# Patient Record
Sex: Male | Born: 1945 | Race: White | Hispanic: No | Marital: Married | Smoking: Former smoker
Health system: Southern US, Community
[De-identification: ages and names within clinical notes are randomized; demographics above are authoritative.]

## PROBLEM LIST (undated history)

## (undated) DIAGNOSIS — J9811 Atelectasis: Secondary | ICD-10-CM

## (undated) DIAGNOSIS — M1990 Unspecified osteoarthritis, unspecified site: Secondary | ICD-10-CM

## (undated) DIAGNOSIS — C4491 Basal cell carcinoma of skin, unspecified: Secondary | ICD-10-CM

## (undated) DIAGNOSIS — E663 Overweight: Secondary | ICD-10-CM

## (undated) DIAGNOSIS — I359 Nonrheumatic aortic valve disorder, unspecified: Secondary | ICD-10-CM

## (undated) DIAGNOSIS — R011 Cardiac murmur, unspecified: Secondary | ICD-10-CM

## (undated) DIAGNOSIS — K589 Irritable bowel syndrome without diarrhea: Secondary | ICD-10-CM

## (undated) DIAGNOSIS — Z9889 Other specified postprocedural states: Secondary | ICD-10-CM

## (undated) DIAGNOSIS — D649 Anemia, unspecified: Secondary | ICD-10-CM

## (undated) DIAGNOSIS — Z87442 Personal history of urinary calculi: Secondary | ICD-10-CM

## (undated) DIAGNOSIS — R0602 Shortness of breath: Secondary | ICD-10-CM

## (undated) DIAGNOSIS — J942 Hemothorax: Secondary | ICD-10-CM

## (undated) DIAGNOSIS — E785 Hyperlipidemia, unspecified: Secondary | ICD-10-CM

## (undated) DIAGNOSIS — K279 Peptic ulcer, site unspecified, unspecified as acute or chronic, without hemorrhage or perforation: Secondary | ICD-10-CM

## (undated) DIAGNOSIS — S2231XA Fracture of one rib, right side, initial encounter for closed fracture: Secondary | ICD-10-CM

## (undated) DIAGNOSIS — D492 Neoplasm of unspecified behavior of bone, soft tissue, and skin: Secondary | ICD-10-CM

## (undated) DIAGNOSIS — I10 Essential (primary) hypertension: Secondary | ICD-10-CM

## (undated) DIAGNOSIS — N200 Calculus of kidney: Secondary | ICD-10-CM

## (undated) DIAGNOSIS — E7800 Pure hypercholesterolemia, unspecified: Secondary | ICD-10-CM

## (undated) HISTORY — DX: Anemia, unspecified: D64.9

## (undated) HISTORY — PX: CHOLECYSTECTOMY: SHX55

## (undated) HISTORY — DX: Overweight: E66.3

## (undated) HISTORY — DX: Atelectasis: J98.11

## (undated) HISTORY — DX: Nonrheumatic aortic valve disorder, unspecified: I35.9

## (undated) HISTORY — PX: ADENOIDECTOMY: SUR15

## (undated) HISTORY — DX: Hyperlipidemia, unspecified: E78.5

## (undated) HISTORY — PX: COLONOSCOPY WITH ESOPHAGOGASTRODUODENOSCOPY (EGD): SHX5779

## (undated) HISTORY — PX: KNEE ARTHROSCOPY: SHX127

## (undated) HISTORY — DX: Neoplasm of unspecified behavior of bone, soft tissue, and skin: D49.2

## (undated) HISTORY — DX: Irritable bowel syndrome without diarrhea: K58.9

## (undated) HISTORY — PX: ELBOW SURGERY: SHX618

## (undated) HISTORY — DX: Other specified postprocedural states: Z98.89

## (undated) HISTORY — DX: Personal history of urinary calculi: Z87.442

## (undated) HISTORY — PX: TONSILLECTOMY AND ADENOIDECTOMY: SUR1326

## (undated) HISTORY — DX: Peptic ulcer, site unspecified, unspecified as acute or chronic, without hemorrhage or perforation: K27.9

---

## 1999-08-07 ENCOUNTER — Ambulatory Visit (HOSPITAL_BASED_OUTPATIENT_CLINIC_OR_DEPARTMENT_OTHER): Admission: RE | Admit: 1999-08-07 | Discharge: 1999-08-07 | Payer: Self-pay | Admitting: Orthopedic Surgery

## 2000-09-11 ENCOUNTER — Encounter: Admission: RE | Admit: 2000-09-11 | Discharge: 2000-09-11 | Payer: Self-pay | Admitting: Urology

## 2000-09-11 ENCOUNTER — Encounter: Payer: Self-pay | Admitting: Urology

## 2000-09-19 ENCOUNTER — Encounter: Admission: RE | Admit: 2000-09-19 | Discharge: 2000-09-19 | Payer: Self-pay | Admitting: Urology

## 2000-09-19 ENCOUNTER — Encounter: Payer: Self-pay | Admitting: Urology

## 2005-02-05 ENCOUNTER — Ambulatory Visit: Payer: Self-pay | Admitting: Internal Medicine

## 2007-06-04 ENCOUNTER — Ambulatory Visit: Payer: Self-pay | Admitting: Cardiology

## 2007-06-20 ENCOUNTER — Ambulatory Visit: Payer: Self-pay

## 2007-06-20 ENCOUNTER — Encounter: Payer: Self-pay | Admitting: Cardiology

## 2007-06-20 ENCOUNTER — Encounter: Payer: Self-pay | Admitting: Internal Medicine

## 2007-06-20 LAB — CONVERTED CEMR LAB
BUN: 13 mg/dL (ref 6–23)
Basophils Absolute: 0 10*3/uL (ref 0.0–0.1)
Basophils Relative: 0.7 % (ref 0.0–1.0)
CO2: 29 meq/L (ref 19–32)
Calcium: 9 mg/dL (ref 8.4–10.5)
Chloride: 110 meq/L (ref 96–112)
Cholesterol: 237 mg/dL (ref 0–200)
Creatinine, Ser: 0.8 mg/dL (ref 0.4–1.5)
Direct LDL: 143.3 mg/dL
Eosinophils Absolute: 0.2 10*3/uL (ref 0.0–0.6)
Eosinophils Relative: 4.1 % (ref 0.0–5.0)
GFR calc Af Amer: 126 mL/min
GFR calc non Af Amer: 104 mL/min
Glucose, Bld: 95 mg/dL (ref 70–99)
HCT: 40.4 % (ref 39.0–52.0)
HDL: 44 mg/dL (ref >39.0)
Hemoglobin: 13.8 g/dL (ref 13.0–17.0)
Lymphocytes Relative: 33.4 % (ref 12.0–46.0)
MCHC: 34.1 g/dL (ref 30.0–36.0)
MCV: 88.2 fL (ref 78.0–100.0)
Monocytes Absolute: 0.4 10*3/uL (ref 0.2–0.7)
Monocytes Relative: 11.1 % — ABNORMAL HIGH (ref 3.0–11.0)
Neutro Abs: 1.9 10*3/uL (ref 1.4–7.7)
Neutrophils Relative %: 50.7 % (ref 43.0–77.0)
Platelets: 257 10*3/uL (ref 150–400)
Potassium: 4.3 meq/L (ref 3.5–5.1)
RBC: 4.58 M/uL (ref 4.22–5.81)
RDW: 11.9 % (ref 11.5–14.6)
Sodium: 142 meq/L (ref 135–145)
TSH: 2.02 microintl units/mL (ref 0.35–5.50)
Total CHOL/HDL Ratio: 5.4
Triglycerides: 162 mg/dL — ABNORMAL HIGH (ref 0–149)
VLDL: 32 mg/dL (ref 0–40)
WBC: 3.7 10*3/uL — ABNORMAL LOW (ref 4.5–10.5)

## 2007-08-27 ENCOUNTER — Ambulatory Visit: Payer: Self-pay | Admitting: Cardiology

## 2008-03-09 ENCOUNTER — Ambulatory Visit: Payer: Self-pay | Admitting: Cardiology

## 2008-04-20 ENCOUNTER — Ambulatory Visit: Payer: Self-pay | Admitting: Cardiology

## 2008-04-20 LAB — CONVERTED CEMR LAB
ALT: 24 units/L (ref 0–53)
AST: 21 units/L (ref 0–37)
Albumin: 3.6 g/dL (ref 3.5–5.2)
Alkaline Phosphatase: 62 units/L (ref 39–117)
Bilirubin, Direct: 0.1 mg/dL (ref 0.0–0.3)
Cholesterol: 198 mg/dL (ref 0–200)
HDL: 52.2 mg/dL (ref >39.0)
LDL Cholesterol: 124 mg/dL — ABNORMAL HIGH (ref 0–99)
Total Bilirubin: 1 mg/dL (ref 0.3–1.2)
Total CHOL/HDL Ratio: 3.8
Total Protein: 6.5 g/dL (ref 6.0–8.3)
Triglycerides: 110 mg/dL (ref 0–149)
VLDL: 22 mg/dL (ref 0–40)

## 2008-06-02 ENCOUNTER — Ambulatory Visit: Payer: Self-pay | Admitting: Cardiology

## 2008-09-03 ENCOUNTER — Encounter: Payer: Self-pay | Admitting: Internal Medicine

## 2008-11-29 ENCOUNTER — Ambulatory Visit: Payer: Self-pay | Admitting: Internal Medicine

## 2008-11-29 DIAGNOSIS — Z87442 Personal history of urinary calculi: Secondary | ICD-10-CM

## 2008-11-29 DIAGNOSIS — D649 Anemia, unspecified: Secondary | ICD-10-CM

## 2008-11-29 DIAGNOSIS — K279 Peptic ulcer, site unspecified, unspecified as acute or chronic, without hemorrhage or perforation: Secondary | ICD-10-CM

## 2008-11-29 DIAGNOSIS — K589 Irritable bowel syndrome without diarrhea: Secondary | ICD-10-CM

## 2008-11-29 DIAGNOSIS — D492 Neoplasm of unspecified behavior of bone, soft tissue, and skin: Secondary | ICD-10-CM

## 2008-11-29 DIAGNOSIS — I10 Essential (primary) hypertension: Secondary | ICD-10-CM | POA: Insufficient documentation

## 2008-11-29 DIAGNOSIS — Z9889 Other specified postprocedural states: Secondary | ICD-10-CM

## 2008-11-29 DIAGNOSIS — E785 Hyperlipidemia, unspecified: Secondary | ICD-10-CM

## 2008-11-29 HISTORY — DX: Hyperlipidemia, unspecified: E78.5

## 2008-11-29 HISTORY — DX: Neoplasm of unspecified behavior of bone, soft tissue, and skin: D49.2

## 2008-11-29 HISTORY — DX: Peptic ulcer, site unspecified, unspecified as acute or chronic, without hemorrhage or perforation: K27.9

## 2008-11-29 HISTORY — DX: Personal history of urinary calculi: Z87.442

## 2008-11-29 HISTORY — DX: Other specified postprocedural states: Z98.89

## 2008-11-29 HISTORY — DX: Irritable bowel syndrome, unspecified: K58.9

## 2008-11-29 HISTORY — DX: Anemia, unspecified: D64.9

## 2009-01-05 ENCOUNTER — Ambulatory Visit: Payer: Self-pay | Admitting: Cardiology

## 2010-10-17 ENCOUNTER — Ambulatory Visit: Payer: Self-pay | Admitting: Cardiology

## 2010-10-18 ENCOUNTER — Encounter: Payer: Self-pay | Admitting: Cardiology

## 2010-10-18 DIAGNOSIS — I359 Nonrheumatic aortic valve disorder, unspecified: Secondary | ICD-10-CM

## 2010-10-18 DIAGNOSIS — E663 Overweight: Secondary | ICD-10-CM

## 2010-10-18 HISTORY — DX: Overweight: E66.3

## 2010-10-18 HISTORY — DX: Nonrheumatic aortic valve disorder, unspecified: I35.9

## 2010-10-18 LAB — CONVERTED CEMR LAB
ALT: 19 units/L (ref 0–53)
AST: 14 units/L (ref 0–37)
Albumin: 3.7 g/dL (ref 3.5–5.2)
Alkaline Phosphatase: 76 units/L (ref 39–117)
Bilirubin, Direct: 0.1 mg/dL (ref 0.0–0.3)
Cholesterol: 241 mg/dL — ABNORMAL HIGH (ref 0–200)
Direct LDL: 167.6 mg/dL
HDL: 52.1 mg/dL (ref >39.00)
Total Bilirubin: 0.9 mg/dL (ref 0.3–1.2)
Total CHOL/HDL Ratio: 5
Total Protein: 6.3 g/dL (ref 6.0–8.3)
Triglycerides: 137 mg/dL (ref 0.0–149.0)
VLDL: 27.4 mg/dL (ref 0.0–40.0)

## 2010-10-19 ENCOUNTER — Encounter: Payer: Self-pay | Admitting: Cardiology

## 2010-10-19 ENCOUNTER — Ambulatory Visit: Payer: Self-pay | Admitting: Cardiology

## 2010-11-16 ENCOUNTER — Ambulatory Visit: Payer: Self-pay | Admitting: Cardiology

## 2010-11-16 ENCOUNTER — Ambulatory Visit (HOSPITAL_COMMUNITY): Admission: RE | Admit: 2010-11-16 | Discharge: 2010-11-16 | Payer: Self-pay | Admitting: Cardiology

## 2010-11-16 ENCOUNTER — Encounter: Payer: Self-pay | Admitting: Cardiology

## 2010-11-16 ENCOUNTER — Ambulatory Visit: Payer: Self-pay

## 2010-11-27 ENCOUNTER — Ambulatory Visit: Payer: Self-pay | Admitting: Cardiology

## 2011-01-30 NOTE — Assessment & Plan Note (Signed)
Summary: f45m  Medications Added CRESTOR 10 MG TABS (ROSUVASTATIN CALCIUM) Take one tablet by mouth daily.      Allergies Added: NKDA  Visit Type:  Follow-up Primary Mamie Diiorio:  Marga Melnick MD  CC:  hypertension and dyslipidemia.  History of Present Illness:  The patient is seen for followup of dyslipidemia, hypertension, dilated aortic root.  He is going well.  He is not having significant symptoms.  He is active.  He works with a Psychologist, educational intermittently.  He has lost a small amount of weight over time but needs to lose more weight.  He has mild dilatation of his aortic root in the past.  We need to follow this up with a followup echo.  I saw him last January, 2010.  Current Medications (verified): 1)  Adult Aspirin Ec Low Strength 81 Mg Tbec (Aspirin) .Marland Kitchen.. 1 By Mouth Once Daily 2)  Crestor 5 Mg Tabs (Rosuvastatin Calcium) .Marland Kitchen.. 1 By Mouth At Bedtime 3)  Altace 5 Mg Tabs (Ramipril) .Marland Kitchen.. 1 By Mouth Once Daily 4)  Amlodipine Besylate 5 Mg Tabs (Amlodipine Besylate) .Marland Kitchen.. 1 By Mouth Once Daily 5)  Coq10 200 Mg Caps (Coenzyme Q10) .Marland Kitchen.. 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 Tolerating Crestor Obesity Hypertension mild dilatation aortic root-43 mm  .Marland Kitchen..echo... June, 2008 Soft systolic murmur-no valvular abnormalities by echo....echo... June, 2008 Trivial aortic regurgitation  ... echo... June, 2008 EF 55-65%.... echo... June, 2008.. IRBBB     incomplete right bundle branch block  Review of Systems       Patient denies fever, chills, headache, sweats, rash, change in vision, change in hearing, chest pain, cough, nausea vomiting, urinary symptoms.  All other systems are reviewed and  are negative.  Vital Signs:  Patient profile:   65 year old male Height:      69.5 inches Weight:      243 pounds BMI:     35.50 Pulse rate:   50 / minute BP sitting:   144 / 82  (left arm) Cuff size:   regular  Vitals Entered By: Hardin Negus, RMA (October 19, 2010 8:50 AM)  Physical Exam  General:  The patient is stable. Head:  head is atraumatic. Eyes:  no xanthelasma. Neck:  no jugular venous distention. Chest Wall:  no chest wall tenderness. Lungs:  lungs are clear.  Respiratory effort is nonlabored. Heart:  cardiac exam reveals S1 and S2.  There is no clicks or significant murmurs. Abdomen:  abdomen is soft. Msk:  no musculoskeletal deformities. Extremities:  no peripheral edema. Skin:  no skin rashes. Psych:  patient is oriented to person time and place.  Affect is normal.   Impression & Recommendations:  Problem # 1:  AORTIC INSUFFICIENCY (ICD-424.1)  His updated medication list for this problem includes:    Altace 5 Mg Tabs (Ramipril) .Marland Kitchen... 1 by mouth once daily This was mild in the past.  He will be reassessed with followup echo.  Problem # 2:  * DILATED AORTIC ROOT His aortic root was 43 mm when first measured in 2008.  He will be reassessed with follow up echo.  Problem # 3:  OVERWEIGHT (ICD-278.02) Once again I talked with him about continuing his exercise program and trying to lose some weight.  Problem # 4:  HYPERTENSION (ICD-401.9)  His updated medication list for this problem includes:    Adult Aspirin Ec Low Strength 81 Mg Tbec (Aspirin) .Marland Kitchen... 1 by mouth once daily    Altace 5 Mg Tabs (Ramipril) .Marland Kitchen... 1 by mouth once daily    Amlodipine Besylate 5 Mg Tabs (Amlodipine besylate) .Marland Kitchen... 1 by mouth once daily Blood pressure is under borderline control.  I encouraged him to check at [facility] and call us if his systolic is over 578.  Weight loss will help with this.  Problem # 5:  HYPERLIPIDEMIA (ICD-272.4)  The following medications were removed  from the medication list:    Crestor 10 Mg Tabs (Rosuvastatin calcium) .Marland Kitchen... Take one tablet by mouth daily. His updated medication list for this problem includes:    Crestor 10 Mg Tabs (Rosuvastatin calcium) .Marland Kitchen... Take one tablet by mouth daily. It is my understanding that the patient was taking 5 mg of Crestor.  I will reverify this.  His LDL is too high.  He needs to watch his diet better and lose weight and increase his Crestor dose.  I will be sure that the dose that we have picked is an increase for him from today.  He will then have followup labs in 6 weeks.  Problem # 6:  * IRBBB EKG is done today and reviewed by me.  There is no change.  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  No further workup needed.  I showed his EKG to him to explain what this term means.  Other Orders: Echocardiogram (Echo)  Patient Instructions: 1)  Increase Crestor to 10mg  daily 2)  Your physician recommends that you return for a FASTING lipid profile: 6 weeks (272.2) 3)  Your physician has requested that you have an echocardiogram.  Echocardiography is a painless test that uses sound waves to create images of your heart. It provides your doctor with information about the size and shape of your heart and how well your heart's chambers and valves are working.  This procedure takes approximately one hour. There are no restrictions for this procedure. 4)  Your physician wants you to follow-up in:  1 year.  You will receive a reminder letter in the mail two months in advance. If you don't receive a letter, please call our office to schedule the follow-up appointment. Prescriptions: CRESTOR 10 MG TABS (ROSUVASTATIN CALCIUM) Take one tablet by mouth daily.  #90 x 3   Entered by:   Meredith Staggers, RN   Authorized by:   Talitha Givens, MD, [facility]   Signed by:   Meredith Staggers, RN on 10/19/2010   Method used:   Electronically to        [facility]* (retail)             ,          Ph: [phone]        Fax: [phone]   RxID:   [id number]

## 2011-01-30 NOTE — Miscellaneous (Signed)
Summary: Appointment Canceled  Appointment status changed to canceled by [employer] on 10/31/2010 3:45 PM.  Cancellation Comments --------------------- echo/dilatec aortic root/bcbs prec.r eq/saf  Appointment Information ----------------------- Appt Type:  CARDIOLOGY ANCILLARY VISIT      Date:  Monday, October 30, 2010      Time:  8:30 AM for 60 min   Urgency:  Routine   Made By:  Pearson Grippe  To Visit:  [id number]    Reason:  echo/dilatec aortic root/bcbs prec.r eq/saf  Appt Comments ------------- -- 10/31/10 15:45: (CEMR) CANCELED -- echo/dilatec aortic root/bcbs prec.r eq/saf -- 10/30/10 15:12: (CEMR) NO SHOW -- echo/dilatec aortic root/bcbs prec.r eq/saf -- 10/19/10 9:32: (CEMR) BOOKED -- Routine CARDIOLOGY ANCILLARY VISIT at 10/30/2010 8:30

## 2011-01-30 NOTE — Miscellaneous (Signed)
Summary: Appointment No Show  Appointment status changed to no show by [employer] on 10/30/2010 3:12 PM.  No Show Comments ---------------- echo/dilatec aortic root/bcbs prec.r eq/saf  Appointment Information ----------------------- Appt Type:  CARDIOLOGY ANCILLARY VISIT      Date:  Monday, October 30, 2010      Time:  8:30 AM for 60 min   Urgency:  Routine   Made By:  Pearson Grippe  To Visit:  [id number]    Reason:  echo/dilatec aortic root/bcbs prec.r eq/saf  Appt Comments ------------- -- 10/30/10 15:12: (CEMR) NO SHOW -- echo/dilatec aortic root/bcbs prec.r eq/saf -- 10/19/10 9:32: (CEMR) BOOKED -- Routine CARDIOLOGY ANCILLARY VISIT at 10/30/2010 8:30 AM for 60 min echo/dilatec aortic root/bcbs prec.r eq/saf

## 2011-01-30 NOTE — Miscellaneous (Signed)
  Clinical Lists Changes  Problems: Added new problem of OVERWEIGHT (ICD-278.02) Added new problem of * DILATED AORTIC ROOT Added new problem of AORTIC INSUFFICIENCY (ICD-424.1)

## 2011-05-15 NOTE — Assessment & Plan Note (Signed)
[facility]                            CARDIOLOGY OFFICE NOTE   NAME:Jordan Baldwin, Jordan Baldwin                       MRN:          [id number]  DATE:08/27/2007                            DOB:          11-Aug-1946    Mr. Umland is here for followup.  He has felt quite well since I saw him  on June 04, 2007.  At that time, we assessed his overall cardiac status.  CBC was normal.  BMET was normal.  TSH was normal.  A 2D echo showed  that his LV size was top normal.  Ejection fraction 55% to 60% with no  significant valvular abnormalities.  Myoview showed good exercise  tolerance with no EKG change, and no signs of scar or ischemia.  His  cholesterol data revealed that his triglycerides were 162, HDL was 44,  and LDL was 143.  TSH was normal.  Today in the office he is quite  stable.   PAST MEDICAL HISTORY:   ALLERGIES:  NO KNOWN DRUG ALLERGIES.   MEDICATIONS:  The patient has been taking aspirin, but we will formerly  start it at 81 mg daily.  Also, I will be starting Zocor 20 and ramipril  2.5.   OTHER MEDICAL PROBLEMS:  See the list below.   REVIEW OF SYSTEMS:  He feels well, and he is not having any significant  complaints.   PHYSICAL EXAMINATION:  Weight is 252 pounds.  Blood pressure 150/90 with  a pulse of 60.  The patient is oriented to person, time, and place.  His affect is  normal.  HEENT:  Reveals no xanthelasma.  He has normal extraocular motion.  There are no carotid bruits.  There is no jugular venous distention.  LUNGS:  Clear.  CARDIAC EXAM:  Reveals an S1 with an S2.  There are no clicks or  significant murmurs.  There is no peripheral edema.   PROBLEMS:  1. History of knee surgery in the past, and elbow surgery.  2. Status post gallbladder surgery.  3. History of irritable bowel.  4. History of kidney stones.  5. 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  6. History of skin cancer on the back of his neck, removed.  7. Hypertension.   His blood pressure does remain mildly elevated.  We      will start ramipril today.  He will be checking his pressure at      home several times a week, and calling the pressures in to [place], and      we will adjust his medication by telephone.  8. Soft systolic murmur.  He has no significant valvular abnormalities      by echo.  9. Weight.  He is beginning his weight loss program.  10.Abnormal feeling in his chest.  He has had no return of this.  His      Myoview was normal.  No further workup now.  11.Question of mild peripheral edema.  He has none today.  12.Elevated LDL and mildly elevated triglycerides.   The plan will be to start ramipril 2.5, and  have blood pressure  followups.  He will start Simvastatin 20, and we will recheck liver and  labs in 8 weeks.  He will be on aspirin 81.  I have given him an  appointment for a 61-month followup, but I will be in touch with him  sooner.     Luis Abed, MD, [facility]  Electronically Signed    JDK/MedQ  DD: 08/27/2007  DT: 08/27/2007  Job #: [id number]   cc:   Titus Dubin. Alwyn Ren, MD,FACP,FCCP

## 2011-05-15 NOTE — Assessment & Plan Note (Signed)
[facility]                            CARDIOLOGY OFFICE NOTE   NAME:Jordan, BARTLETT Baldwin                       MRN:          [id number]  DATE:01/05/2009                            DOB:          10-03-1946    Mr. Jordan Baldwin is here for followup.  At the [facility], amlodipine was added to his  meds and he has excellent blood pressure control.  He saw Dr. Alwyn Baldwin  recently for complete history and physical.  His LDL remains in the  range of 130.  His HDL is good.  This is while taking 5 of Crestor.  Previously, he did not tolerate Zocor well, but he is tolerating  Crestor.  He is not having any chest pain.  He is overweight.  He has  gained some more weight.  We have talked about this and he says that he  will begin to try to lose weight.   ALLERGIES:  He had felt poorly with ZOCOR.   MEDICATIONS:  1. Aspirin 81.  2. Multivitamin.  3. Ramipril 5.  4. Amlodipine 5.  5. Crestor 5 mg (to be increased to 10 mg).   OTHER MEDICAL PROBLEMS:  See the list on my note of June 02, 2008.   REVIEW OF SYSTEMS:  He is feeling well.  He has no fevers or chills.  He  has no headaches.  There are no skin rashes.  He has some mild back  discomfort.  His review of systems otherwise is negative.  He had an  episode of bronchitis and he still has a very mild cough, but it is  continuing to improve.   PHYSICAL EXAMINATION:  Weight is 251 pounds.  Blood pressure is 126/82  with a pulse of 59.  The patient is oriented to person, time, and place.  Affect is normal.  HEENT reveals no xanthelasma.  He has normal  extraocular motion.  There are no carotid bruits.  There is no jugular  venous distention.  Lungs are clear.  Respiratory effort is not labored.  Cardiac exam reveals an S1 with an S2.  There are no clicks or  significant murmurs.  His abdomen is soft.  He has no peripheral edema.   EKG reveals old incomplete right bundle-branch block.   Problems are listed on my note of June 02, 2008.  #8.  Hypertension.  This is now well treated.  #10.  Overweight.  This will be a key issue for him.  #12.  Elevated LDL.  We will push his Crestor up to 10 mg.     Jordan Abed, MD, [facility]  Electronically Signed    JDK/MedQ  DD: 01/05/2009  DT: 01/06/2009  Job #: [id number]   cc:   Jordan Baldwin. Jordan Ren, MD,FACP,FCCP

## 2011-05-15 NOTE — Assessment & Plan Note (Signed)
[facility]                            CARDIOLOGY OFFICE NOTE   NAME:Jordan Baldwin, Jordan Baldwin                       MRN:          [id number]  DATE:06/02/2008                            DOB:          [birth year]    Mr. Avans is here for followup.  I saw him last in March of 2009.  At  that time his blood pressure was reasonably controlled on low-dose ACE  inhibitor.  He had a history of elevated LDL and triglycerides.  There  is a question that he had felt poorly with Zocor.  We carefully  discussed trying another statin, and I placed him on Crestor 5 and he is  tolerating this.  He did have some followup labs that are dated April 20, 2008.  His liver functions were fine.  Total cholesterol was 198  with triglycerides 110, HDL 52, and his LDL was down from 140 to 124.  The patient does not have proven coronary or proven vascular disease.   PAST MEDICAL HISTORY:   ALLERGIES:  No known drug allergies.   MEDICATIONS:  1. Crestor 5.  2. Ramipril 2.5.  3. Aspirin 81.  4. Multivitamin.   OTHER MEDICAL PROBLEMS:  See the list below.   REVIEW OF SYSTEMS:  He is having some back pain.  He is going to see a  chiropractor today.  This does not sound like ischemia.  Otherwise, his  review of systems is negative.   PHYSICAL EXAMINATION:  Weight today is 249 pounds.  Blood pressure is  140/86 with a pulse of 54.  The patient is oriented to person, time, and place.  Affect is normal.  HEENT:  No xanthelasma.  He has normal extraocular  motion.  There are  no carotid bruits.  There is no jugular venous distension.  LUNGS:  Clear.  Respiratory effort is not labored.  CARDIAC EXAM:  An S1 with an S2.  There are no clicks or significant  murmurs.  His abdomen is protuberant but soft.  He has no peripheral edema.   EKG reveals no significant change.   PROBLEMS:  1. History of knee surgery in the past.  2. History of elbow surgery in the past.  3. Status post  cholecystectomy.  4. History of irritable bowel.  5. History of kidney stones.  6. 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  7. History of skin cancer on the back and the neck, removed.  8. Hypertension.  Blood pressure is treated on low-dose ramipril.  9. Soft systolic murmur, no valvular abnormalities.  10.Overweight.  We talked about this again.  He says that he will be      getting back to his exercise program.  This will be a very      important issue for him.  11.Question of mild peripheral edema in the past.  This has not been a      significant problem.  12.Elevated LDL and mildly elevated triglycerides.  On low-dose      Crestor triglycerides have normalized.  LDL is 124.  There is no  proven coronary disease.  I promised him we would not push      medicines any higher than absolutely necessary.  With this in mind,      we will keep him at his current dose of medicines.  13.I will see him back in 6 months for followup.     Luis Abed, MD, [facility]  Electronically Signed    JDK/MedQ  DD: 06/02/2008  DT: 06/02/2008  Job #: [id number]   cc:   Titus Dubin. Alwyn Ren, MD,FACP,FCCP

## 2011-05-15 NOTE — Assessment & Plan Note (Signed)
[facility]                            CARDIOLOGY OFFICE NOTE   NAME:Jordan Baldwin, Jordan Baldwin                       MRN:          [id number]  DATE:06/04/2007                            DOB:          1946/11/06    Jordan Baldwin is a friend of the family and has a family history of coronary  disease and he is here for full cardiac evaluation.  He has been active  over the years.  He has had a vague fluttering sensation in his chest on  a few occasions.  He has not had any major chest pain.  He has no major  shortness of breath.  He does not have syncope or presyncope.  He was  encouraged to start some Lipitor in the past which he chose not to do.  He also has had a mildly elevated blood pressure when checked when he  had some dental work done recently.   PAST MEDICAL HISTORY:   ALLERGIES:  There are no known drug allergies.   MEDICATIONS:  The patient is on no medications.   OTHER MEDICAL PROBLEMS:  See the complete list below.   SOCIAL HISTORY:  The patient is married.  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  He continues to work in various  capacities in other ways at this time.  He does not smoke.  Historically  he stopped smoking in 1976.  He does drink wine and a caffeinated  beverage daily.   FAMILY HISTORY:  The patient's father had an MI in his 75s.  His mother  is alive at age 55 and he has a sister and a brother alive at age 56 and  66 with no documented coronary disease.   REVIEW OF SYSTEMS:  He has had some problems with irritable bowel in the  past and he has had kidney stones and has some arthritis and difficulty  in his right knee.  He had a skin cancer on the back of his neck removed  in 1998.  Overall he feels well and his review of systems otherwise is  negative.  He has noted some mild edema recently.  This comes on during  the day and goes away at night.  There are no major musculoskeletal  deformities.   PHYSICAL EXAMINATION:  Weight today is 252 pounds.  His blood pressure  is 150/92 with a pulse of 67.  The patient is oriented to person, time and place.  Affect is normal.  HEENT:  Reveals no xanthelasma.  He has normal extraocular motion.  His  conjunctivae are normal.  LUNGS:  Clear.  Respiratory effort is not labored.  CARDIAC:  Reveals an S1 with an S2.  There is a 2/6 systolic murmur that  is heard best at the apex.  ABDOMEN:  Obese, but soft.  He has trace peripheral edema.   EKG reveals non-specific ST-T-wave changes and some increased voltage.   PROBLEMS:  1. History of knee surgery in the past and elbow surgery.  2. Status post gallbladder surgery in the past.  3. History of some irritable bowel.  4. History of kidney stones.  5. Arthritis in his hands and feet that is mild.  6. History of a skin cancer removed from the back of his neck in 1998.  7. Hypertension.  He is to begin his weight loss program today and we      will watch his blood pressure.  I have chosen not to start any      medications today, but he may well need treatment.  Mildly abnormal      EKG with non-specific ST-T-wave changes.  8. Systolic murmur.  Exact etiology is not clear to me as it is best      heard at the apex.  He does need a 2-D echo to assess his bowels      and to assess for left ventricular hypertrophy.  9. Overweight.  He and I have talked in length about beginning a      weight loss program which he will.  10.Abnormal feeling in his chest.  We need to be sure this is not      angina.  I doubt any major arrhythmias.  He will have a stress      Myoview scan.  We will try to have him walk on the treadmill.  He      says that in the past he has not been able to elevate his heart      rate significantly on a treadmill.  I suspect we will be able to.      If not we will change to adenosine.  11.Mild peripheral edema.  This is probably venous.  We will approach      this further after we see  his echo.  12.Some discomfort in his right knee historically.   The patient will have a CBC, BMET and TSH.  A 2-D echo will be done.  He  will have a Myoview as described and he will have a fasting lipid  profile.  He will begin to work on his weight and we will be back in  touch with him with the results of his studies.     Luis Abed, MD, [facility]  Electronically Signed    JDK/MedQ  DD: 06/04/2007  DT: 06/05/2007  Job #: [id number]   cc:   Titus Dubin. Alwyn Ren, MD,FACP,FCCP

## 2011-05-15 NOTE — Assessment & Plan Note (Signed)
[facility]                            CARDIOLOGY OFFICE NOTE   NAME:Burkemper, JAMIEN CASANOVA                       MRN:          [id number]  DATE:03/09/2008                            DOB:          1946/04/25    HISTORY:  Mr. Bebout is seen for cardiology followup.  We know he has  good LV function by echo.  We know that his Myoview scan revealed no  scar or ischemia.  He had an LDL of 143 and his triglycerides were 162  last summer.  We tried 20 of Zocor and he felt aches and pain.   He has lost some weight, but unfortunately gained some back.  He is down  5 pounds since August 2008.  His blood pressure is under good control.  He has not had any chest pain or shortness of breath.   ALLERGIES:  NO KNOWN DRUG ALLERGIES, but he seems to be intolerant to  ZOCOR.   MEDICATIONS:  1. Aspirin 81.  2. Ramipril 2.5.  3. Multivitamin.   OTHER MEDICAL PROBLEMS:  See the list on my note of August 27, 2007.   REVIEW OF SYSTEMS:  He is feeling well.  He is disappointed with his  weight, but he knows its from his overall dietary approach.   PHYSICAL EXAMINATION:  VITAL SIGNS:  Blood pressure is 123/75 with a  pulse of 54.  GENERAL:  The patient is oriented to person, time and place.  Affect is  normal.  HEENT:  Reveals no xanthelasma.  He has normal extraocular  motion.  NECK:  There are no carotid bruits.  There is no jugular venous  distention.  LUNGS:  Clear.  Respiratory effort is not labored.  CARDIAC:  Reveals S1-S2.  There are no clicks or significant murmurs.  ABDOMEN:  Obese, but soft.  EXTREMITIES:  He has no peripheral edema.   DIAGNOSTICS:  EKG today reveals incomplete right bundle branch block.  This is a slight change from the past, but not diagnostic.   PROBLEMS:  Problems are listed on the note of August 27, 2007.  1. Hypertension.  On low-dose ramipril, he appears to be nicely      controlled  2. Elevated LDL and elevated triglycerides.  I  discussed with him at      great length the importance again of losing weight.  We also talked      about his lipids.  I feel that he should be      on a statin.  After a long discussion, he finally agreed to try 5      mg of Crestor.  He will also try to lose weight and I will see him      back in 3 months specifically to check his progress.     Luis Abed, MD, [facility]  Electronically Signed    JDK/MedQ  DD: 03/09/2008  DT: 03/10/2008  Job #: [id number]   cc:   Titus Dubin. Alwyn Ren, MD,FACP,FCCP

## 2013-07-28 ENCOUNTER — Inpatient Hospital Stay (HOSPITAL_COMMUNITY): Payer: Medicare Other

## 2013-07-28 ENCOUNTER — Encounter (HOSPITAL_COMMUNITY): Payer: Self-pay | Admitting: Emergency Medicine

## 2013-07-28 ENCOUNTER — Emergency Department (HOSPITAL_COMMUNITY): Payer: Medicare Other

## 2013-07-28 ENCOUNTER — Inpatient Hospital Stay (HOSPITAL_COMMUNITY)
Admission: EM | Admit: 2013-07-28 | Discharge: 2013-08-02 | DRG: 167 | Disposition: A | Discharge status: Home / Self Care | Payer: Medicare Other | Attending: Thoracic Surgery (Cardiothoracic Vascular Surgery) | Admitting: Thoracic Surgery (Cardiothoracic Vascular Surgery)

## 2013-07-28 DIAGNOSIS — E785 Hyperlipidemia, unspecified: Secondary | ICD-10-CM | POA: Diagnosis present

## 2013-07-28 DIAGNOSIS — S2231XA Fracture of one rib, right side, initial encounter for closed fracture: Secondary | ICD-10-CM

## 2013-07-28 DIAGNOSIS — I359 Nonrheumatic aortic valve disorder, unspecified: Secondary | ICD-10-CM | POA: Diagnosis present

## 2013-07-28 DIAGNOSIS — Z85828 Personal history of other malignant neoplasm of skin: Secondary | ICD-10-CM

## 2013-07-28 DIAGNOSIS — E663 Overweight: Secondary | ICD-10-CM | POA: Diagnosis present

## 2013-07-28 DIAGNOSIS — J9811 Atelectasis: Secondary | ICD-10-CM

## 2013-07-28 DIAGNOSIS — R0789 Other chest pain: Secondary | ICD-10-CM

## 2013-07-28 DIAGNOSIS — K5900 Constipation, unspecified: Secondary | ICD-10-CM | POA: Diagnosis not present

## 2013-07-28 DIAGNOSIS — D649 Anemia, unspecified: Secondary | ICD-10-CM | POA: Diagnosis present

## 2013-07-28 DIAGNOSIS — J9819 Other pulmonary collapse: Secondary | ICD-10-CM | POA: Diagnosis present

## 2013-07-28 DIAGNOSIS — S271XXA Traumatic hemothorax, initial encounter: Principal | ICD-10-CM | POA: Diagnosis present

## 2013-07-28 DIAGNOSIS — W19XXXA Unspecified fall, initial encounter: Secondary | ICD-10-CM | POA: Diagnosis present

## 2013-07-28 DIAGNOSIS — J942 Hemothorax: Secondary | ICD-10-CM

## 2013-07-28 DIAGNOSIS — Y92009 Unspecified place in unspecified non-institutional (private) residence as the place of occurrence of the external cause: Secondary | ICD-10-CM

## 2013-07-28 DIAGNOSIS — I10 Essential (primary) hypertension: Secondary | ICD-10-CM | POA: Diagnosis present

## 2013-07-28 HISTORY — DX: Unspecified osteoarthritis, unspecified site: M19.90

## 2013-07-28 HISTORY — DX: Basal cell carcinoma of skin, unspecified: C44.91

## 2013-07-28 HISTORY — DX: Fracture of one rib, right side, initial encounter for closed fracture: S22.31XA

## 2013-07-28 HISTORY — DX: Hemothorax: J94.2

## 2013-07-28 HISTORY — PX: CHEST TUBE INSERTION: SHX231

## 2013-07-28 HISTORY — DX: Calculus of kidney: N20.0

## 2013-07-28 HISTORY — DX: Cardiac murmur, unspecified: R01.1

## 2013-07-28 HISTORY — DX: Shortness of breath: R06.02

## 2013-07-28 HISTORY — DX: Atelectasis: J98.11

## 2013-07-28 HISTORY — DX: Pure hypercholesterolemia, unspecified: E78.00

## 2013-07-28 HISTORY — DX: Essential (primary) hypertension: I10

## 2013-07-28 LAB — CBC
MCH: 29.7 pg (ref 26.0–34.0)
MCHC: 34.1 g/dL (ref 30.0–36.0)
RDW: 12.3 % (ref 11.5–15.5)

## 2013-07-28 LAB — BASIC METABOLIC PANEL
BUN: 15 mg/dL (ref 6–23)
Calcium: 9.3 mg/dL (ref 8.4–10.5)
GFR calc Af Amer: >90 mL/min (ref >90)
GFR calc non Af Amer: >90 mL/min (ref >90)
Potassium: 3.9 mEq/L (ref 3.5–5.1)
Sodium: 138 mEq/L (ref 135–145)

## 2013-07-28 MED ORDER — ONDANSETRON HCL 4 MG/2ML IJ SOLN: 4.0000 mg | Freq: Four times a day (QID) | INTRAMUSCULAR | Status: DC | PRN

## 2013-07-28 MED ORDER — MIDAZOLAM HCL 2 MG/2ML IJ SOLN
2.0000 mg | Freq: Once | INTRAMUSCULAR | Status: AC
  Administered 2013-07-28: 2 mg via INTRAVENOUS

## 2013-07-28 MED ORDER — FENTANYL CITRATE 0.05 MG/ML IJ SOLN
100.0000 ug | Freq: Once | INTRAMUSCULAR | Status: AC
  Administered 2013-07-28: 100 ug via INTRAVENOUS

## 2013-07-28 MED ORDER — ACETAMINOPHEN 325 MG PO TABS: 650.0000 mg | ORAL_TABLET | Freq: Four times a day (QID) | ORAL | Status: DC | PRN

## 2013-07-28 MED ORDER — FENTANYL CITRATE 0.05 MG/ML IJ SOLN
INTRAMUSCULAR | Status: AC
  Filled 2013-07-28: qty 2

## 2013-07-28 MED ORDER — OXYCODONE HCL 5 MG PO TABS
5.0000 mg | ORAL_TABLET | ORAL | Status: DC | PRN
  Administered 2013-07-28 – 2013-07-30 (×8): 10 mg via ORAL
  Filled 2013-07-28 (×8): qty 2

## 2013-07-28 MED ORDER — MIDAZOLAM HCL 2 MG/2ML IJ SOLN
INTRAMUSCULAR | Status: AC
  Filled 2013-07-28: qty 4

## 2013-07-28 NOTE — ED Notes (Signed)
Chest tube tray and [place] placed at bedside for Dr. Barry Dienes

## 2013-07-28 NOTE — ED Notes (Signed)
Pt c/o right sided rib pain after falling last Thursday; pt sts seen at [facility]; pt had another xray today and sent here for eval for pleural effusion

## 2013-07-28 NOTE — ED Provider Notes (Signed)
CSN: [id number]     Arrival date & time 07/28/13  1150 History     First MD Initiated Contact with Patient 07/28/13 1202     Chief Complaint  Patient presents with  . Flank Pain   (Consider location/radiation/quality/duration/timing/severity/associated sxs/prior Treatment) HPI Jordan Baldwin 67 y.o. presented at the recommendation of his orthopedic surgeon for possible right-sided pleural effusion. Patient presented to his orthopedic surgeon for concern of right-sided elbow pain related to a fall 7 days ago. Patient had a mechanical fall 7 days ago in [place], [place]. She reportedly slipped on a wet surface landing on his right thorax. He noticed immediate pain in his right elbow and right thorax. He was seen at a local emergency department where he had a chest x-ray performed, x-ray of his right elbow, and x-ray of his right shoulder which showed no evidence of fractures, pleural effusions. Patient is been using his incentive spirometer trying greater than 1500 cc. He is ongoing right-sided pain, but has been improving compared to the onset. His refrain from any exercise as well. He does note to be somewhat short of breath with "a good amount of walking". He denies chest pain, nausea, vomiting, diaphoresis. Today at his orthopedic surgeon's office he was noted to have a right-sided pleural effusion and was sent here for further evaluation and management. Imaging is not currently available. No cardiac history. No current use of blood thinners or antiplatelets.  History reviewed. No pertinent past medical history. History reviewed. No pertinent past surgical history. History reviewed. No pertinent family history. History  Substance Use Topics  . Smoking status: Never Smoker   . Smokeless tobacco: Not on file  . Alcohol Use: Yes     Comment: occ    Review of Systems  Constitutional: Negative for fever, chills, diaphoresis, activity change, appetite change and fatigue.  HENT: Negative  for congestion, sore throat, rhinorrhea, sneezing and trouble swallowing.   Eyes: Negative for pain and redness.  Respiratory: Positive for chest tightness and shortness of breath. Negative for cough, choking, wheezing and stridor.   Cardiovascular: Positive for chest pain (Chest wall- thorax right-sided). Negative for leg swelling.  Gastrointestinal: Positive for nausea. Negative for vomiting, diarrhea, constipation, blood in stool, abdominal distention and anal bleeding.  Musculoskeletal: Negative for myalgias and back pain.  Skin: Negative for rash.  Neurological: Negative for dizziness, speech difficulty, weakness, light-headedness, numbness and headaches.  Hematological: Negative for adenopathy.  Psychiatric/Behavioral: Negative for confusion.    Allergies  Review of patient's allergies indicates no known allergies.  Home Medications   Current Outpatient Rx  Name  Route  Sig  Dispense  Refill  . ibuprofen (ADVIL,MOTRIN) 200 MG tablet   Oral   Take 200 mg by mouth every 6 (six) hours as needed for pain.          BP 165/93  Pulse 62  Temp(Src) 97.4 F (36.3 C) (Oral)  Resp 17  SpO2 95% Physical Exam  Nursing note and vitals reviewed. Constitutional: He is oriented to person, place, and time. He appears well-developed and well-nourished. No distress.  HENT:  Head: Normocephalic and atraumatic.  Eyes: Conjunctivae and EOM are normal. Right eye exhibits no discharge. Left eye exhibits no discharge.  Neck: Normal range of motion. Neck supple. No tracheal deviation present.  Cardiovascular: Normal rate, regular rhythm and normal heart sounds.  Exam reveals no friction rub.   No murmur heard. Pulmonary/Chest: Effort normal. No stridor. No respiratory distress. He has decreased breath sounds  in the right middle field and the right lower field. He has no wheezes. He has no rhonchi. He has no rales. He exhibits no tenderness.  Abdominal: Soft. He exhibits no distension. There is  no tenderness. There is no rebound and no guarding.  Neurological: He is alert and oriented to person, place, and time.  Skin: Skin is warm.  Psychiatric: He has a normal mood and affect.    ED Course   Procedures (including critical care time)  Labs Reviewed  CBC - Abnormal; Notable for the following:    RBC 3.64 (*)    Hemoglobin 10.8 (*)    HCT 31.7 (*)    All other components within normal limits  BASIC METABOLIC PANEL - Abnormal; Notable for the following:    Glucose, Bld 103 (*)    All other components within normal limits   Ct Chest Wo Contrast  07/28/2013   *RADIOLOGY REPORT*  Clinical Data: Right chest pain since falling 4 days ago.  CT CHEST WITHOUT CONTRAST  Technique:  Multidetector CT imaging of the chest was performed following the standard protocol without IV contrast.  Comparison: None.  Findings: Vascular assessment is limited by the lack of intravenous contrast.  There is no evidence of mediastinal hematoma or other secondary evidence of great vessel injury.  Scattered atherosclerosis is noted involving the thoracic aorta, great vessels and coronary arteries.  There are no enlarged mediastinal or hilar lymph nodes.  There is a moderate sized medium density dependent pleural effusion on the right.  There is no left pleural effusion or pericardial effusion.  There is compressive atelectasis within the right lower and middle lobes.  There is no evidence of endobronchial lesion or tracheobronchial injury. The left lung is clear.  No fractures are identified on the axial images.  However, the coronal scout image does demonstrate a fracture of the right tenth rib laterally.  There are degenerative changes throughout the thoracic spine.  The visualized upper abdomen is unremarkable. There are large calcified loose bodies within the superior subscapularis recess of the left shoulder.  IMPRESSION:  1.  Moderate sized dependent medium density right pleural effusion, likely hemothorax.   This is presumably secondary to a fracture of the right tenth rib, only imaged on the scout examination. 2.  Associated right middle lobe and right lower lobe atelectasis. 3.  No evidence of mediastinal hematoma.  No large loose body in the left shoulder superior subscapularis recess.  Radiographic followup of the presumed hemothorax is recommended. If this fails to improve with conservative therapy, thoracentesis may be warranted.   Original Report Authenticated By: Carey Bullocks, M.D.   1. Chest wall pain     MDM  Kristie Cowman 67 y.o. presents for concern of a right-sided pleural effusion noted on chest x-ray at his orthopedic surgeon's office. Patient is having some slight shortness of breath. No respiratory distress. Afebrile vital signs stable. Normotensive. Hemodynamically stable. Decreased breath sounds third of the way up on the right side posteriorly. Very minimal tenderness palpation over that area injury. Concern for hemothorax at this time. Will obtain CT of his chest without contrast to assess for hemothorax, pneumothorax, and rib fractures. CBC obtained for baseline hemoglobin.  CT scan significant for moderate right-sided hemothorax. Cardiothoracic surgery consulted and indicated they would admit the patient had a chest tube placed.  Labs, and imaging reviewed. I discussed this patient's care with my attending, Dr. Radford Pax.   Sena Hitch, MD 07/28/13 [phone]

## 2013-07-28 NOTE — ED Notes (Signed)
Patient transported to CT 

## 2013-07-28 NOTE — ED Notes (Signed)
Cardiothoracic Surgery PA left and Dr.Owens (Cardiothoracic Surgeon) is coming to see pt in ED

## 2013-07-28 NOTE — Op Note (Signed)
CARDIOTHORACIC SURGERY OPERATIVE NOTE  Date of Procedure:  07/28/2013  Preoperative Diagnosis: Right Hemothorax  Postoperative Diagnosis: Same  Procedure:   Right chest tube placement  Surgeon:   Salvatore Decent. Cornelius Moras, MD  Anesthesia:   1% lidocaine local with intravenous sedation    DETAILS OF THE OPERATIVE PROCEDURE  Following full informed consent the patient was given midazolam 4 mg and fentanyl 100 mcg intravenously and continuously monitored for rhythm, BP and oxygen saturation. The right chest was prepared and draped in a sterile manner. 1% lidocaine was utilized to anesthetize the skin and subcutaneous tissues. A small incision was made and a 28 French straight chest tube was placed through the incision into the pleural space. The tube was secured to the skin and connected to a closed suction collection device. A total of approximately 300 mL of grossly bloody fluid was evacuated and a portion sent for culture and cytology.  The patient tolerated the procedure well. A portable CXR was ordered. There were no complications.    Salvatore Decent. Cornelius Moras, MD 07/28/2013 5:09 PM

## 2013-07-28 NOTE — H&P (Signed)
Subjective:   Jordan Baldwin is a 67 yo white male who was referred to the Emergency Department for evaluation of a Right sided Pleural effusion.  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  He suffered a fall on his right sided.  He presented to the local Emergency Department at which time he was diagnosed with a broken tenth rib on his right side.  He was given some pain medication and instructed to follow up with his PCP in one week.  He presented for follow up today at which time the patient complained of some tightness and shortness of breath with deep inspiration.  CXR was obtained and showed a moderate sized Right sided Pleural effusion.  Due to his recent fall he was referred to [facility] Emergency Department for further evaluation.  Upon arrival he underwent further workup with a Non Contrast CT scan which confirmed the presence of a Right sided pneumothorax.  It was felt this was most likely a Hemothorax due to his recent fall and subsequent rib fractures.  TCTS consult was placed.  Currently the patient is comfortable.  He states he does have some mild right sided pain that is worse with inspiration.  His medical history is positive for a heart murmur of Aortic Insufficiency.  This has been followed by Dr. Myrtis Baldwin.  The patient denies any other medical problems.  He also states he does not take any medications at home.  He denies history of smoking.  Patient Active Problem List   Diagnosis Date Noted  . OVERWEIGHT 10/18/2010  . AORTIC INSUFFICIENCY 10/18/2010  . NEOPLASM, SKIN 11/29/2008  . HYPERLIPIDEMIA 11/29/2008  . ANEMIA-NOS 11/29/2008  . HYPERTENSION 11/29/2008  . PEPTIC ULCER DISEASE 11/29/2008  . IBS 11/29/2008  . RENAL CALCULUS, HX OF 11/29/2008  . ARTHROSCOPY, RIGHT KNEE, HX OF 11/29/2008   History reviewed. No pertinent past medical history   History reviewed. No pertinent past surgical history. -  Tonsillectomy/Adenoidectomy -Cholesystectomy -Right Knee Surgery -Right Elbow surgery   (Not in a hospital admission) No Known Allergies  History  Substance Use Topics  . Smoking status: Never Smoker   . Smokeless tobacco: Not on file  . Alcohol Use: Yes     Comment: occ    History reviewed. No pertinent family history.  Review of Systems Constitutional: negative for chills, fatigue and sweats Eyes: negative Respiratory: positive for dyspnea Cardiovascular: positive for chest wall pani Gastrointestinal: negative Integument/breast: negative Neurological: negative  Objective:   Patient Vitals for the past 8 hrs:  BP Temp Temp src Pulse Resp SpO2  07/28/13 1154 165/93 mmHg 97.4 F (36.3 C) Oral 62 17 95 %   BP 165/93  Pulse 62  Temp(Src) 97.4 F (36.3 C) (Oral)  Resp 17  SpO2 95% General appearance: alert, cooperative and no distress Eyes: conjunctivae/corneas clear. PERRL, EOM's intact. Fundi benign. Lungs: diminished breath sounds right base Chest wall: right sided chest wall tenderness Heart: regular rate and rhythm, 2/6 systolic murmur Abdomen: soft, non-tender; bowel sounds normal; no masses,  no organomegaly Extremities: extremities normal, atraumatic, no cyanosis or edema Neurologic: Grossly normal  Data Review:  CBC:  Lab Results  Component Value Date   WBC 5.9 07/28/2013   RBC 3.64* 07/28/2013    CT Scan:   1. Moderate sized dependent medium density right pleural effusion,  likely hemothorax. This is presumably secondary to a fracture of  the right tenth rib, only imaged  on the scout examination.  2. Associated right middle lobe and right lower lobe atelectasis.  3. No evidence of mediastinal hematoma. No large loose body in  the left shoulder superior subscapularis recess.  Radiographic followup of the presumed hemothorax is recommended.  If this fails to improve with conservative therapy, thoracentesis  may be warranted.  A/P:  1. Admit to  Inpatient Unit 2000 2. Hemothorax- will require chest tube placement 3. Pain control- will order Oxy IR q3 prn 4. Dispo- Dr. Cornelius Moras aware of patient, will evaluate patient for chest tube placement   I have seen and examined the patient and agree with the assessment and plan as outlined.  I have discussed the indications, risks and potential benefits of chest tube placement with Jordan Baldwin.  Alternative treatment strategies discussed.  All questions answered.  Purcell Nails 07/28/2013 4:39 PM

## 2013-07-28 NOTE — ED Notes (Signed)
Cardiothoracic Surgery PA speaking with pt at this time

## 2013-07-29 ENCOUNTER — Inpatient Hospital Stay (HOSPITAL_COMMUNITY): Payer: Medicare Other

## 2013-07-29 ENCOUNTER — Encounter (HOSPITAL_COMMUNITY): Payer: Self-pay | Admitting: General Practice

## 2013-07-29 DIAGNOSIS — S271XXA Traumatic hemothorax, initial encounter: Secondary | ICD-10-CM

## 2013-07-29 NOTE — Progress Notes (Addendum)
      [address]       [name] [address]             [phone]            Subjective: Patient with some pain at chest tube site, but is controlled with narcotics.  Objective: Vital signs in last 24 hours: Temp:  [97.4 F (36.3 C)-99.2 F (37.3 C)] 98.9 F (37.2 C) (07/30 0339) Pulse Rate:  [57-72] 58 (07/30 0339) Cardiac Rhythm:  [-] Normal sinus rhythm (07/29 2015) Resp:  [15-31] 19 (07/30 0339) BP: (118-165)/(63-94) 150/79 mmHg (07/30 0339) SpO2:  [94 %-100 %] 96 % (07/30 0339) Weight:  [111.948 kg (246 lb 12.8 oz)] 111.948 kg (246 lb 12.8 oz) (07/29 1811)     Intake/Output from previous day: 07/29 0701 - 07/30 0700 In: 240 [P.O.:240] Out: 150 [Chest Tube:150]   Physical Exam:  Cardiovascular: RRR, no murmurs, gallops, or rubs. Pulmonary: Clear to auscultation on left and diminished at right base; no rales, wheezes, or rhonchi. Abdomen: Soft, non tender, bowel sounds present. Extremities: No lower extremity edema. Wounds: Clean and dry.  No erythema or signs of infection. Chest Tube: Dark bloody output, to suction and no air leak  Lab Results: CBC: Recent Labs  07/28/13 1356  WBC 5.9  HGB 10.8*  HCT 31.7*  PLT 298   BMET:  Recent Labs  07/28/13 1356  NA 138  K 3.9  CL 103  CO2 26  GLUCOSE 103*  BUN 15  CREATININE 0.79  CALCIUM 9.3    PT/INR: No results found for this basename: LABPROT, INR,  in the last 72 hours ABG:  INR: Will add last result for INR, ABG once components are confirmed Will add last 4 CBG results once components are confirmed  Assessment/Plan:  1. CV - Hypertensive. Patient states was previously on medicine but stopped taking due to side effects. Will need to follow up with medical doctor. 2.  Pulmonary - Chest tube with 150 cc of output. Chest tube is to suction and no air leak. Consider placing to water seal.CXR this am shows no pneumothorax, low lung volumes, worsening aeration right lung base  (atelectasis, some fluid). He has an incentive spirometer at home, and is going to ask wife to bring this in. Preliminary right pleural fluid shows no organisms seen.  ZIMMERMAN,DONIELLE MPA-C 07/29/2013,7:33 AM  I have seen and examined the patient and agree with the assessment and plan as outlined.  Will leave tube in today and likely remove tomorrow.  Shana Younge H 07/29/2013 8:39 AM

## 2013-07-29 NOTE — ED Provider Notes (Signed)
I saw and evaluated the patient, reviewed the resident's note and I agree with the findings and plan.   .Face to face Exam:  General:  Awake HEENT:  Atraumatic Resp:  Normal effort Abd:  Nondistended Neuro:No focal weakness  Nelia Shi, MD 07/29/13 [phone]

## 2013-07-30 ENCOUNTER — Inpatient Hospital Stay (HOSPITAL_COMMUNITY): Payer: Medicare Other

## 2013-07-30 ENCOUNTER — Encounter (HOSPITAL_COMMUNITY): Payer: Self-pay | Admitting: Anesthesiology

## 2013-07-30 ENCOUNTER — Encounter (HOSPITAL_COMMUNITY)
Admission: EM | Disposition: A | Discharge status: Home / Self Care | Payer: Self-pay | Source: Home / Self Care | Attending: Thoracic Surgery (Cardiothoracic Vascular Surgery)

## 2013-07-30 ENCOUNTER — Encounter (HOSPITAL_COMMUNITY): Payer: Self-pay | Admitting: *Deleted

## 2013-07-30 ENCOUNTER — Inpatient Hospital Stay (HOSPITAL_COMMUNITY): Payer: Medicare Other | Admitting: *Deleted

## 2013-07-30 DIAGNOSIS — S271XXA Traumatic hemothorax, initial encounter: Secondary | ICD-10-CM

## 2013-07-30 HISTORY — PX: VIDEO ASSISTED THORACOSCOPY (VATS)/THOROCOTOMY: SHX6173

## 2013-07-30 LAB — COMPREHENSIVE METABOLIC PANEL
ALT: 26 U/L (ref 0–53)
AST: 23 U/L (ref 0–37)
Alkaline Phosphatase: 90 U/L (ref 39–117)
CO2: 27 mEq/L (ref 19–32)
Chloride: 96 mEq/L (ref 96–112)
GFR calc non Af Amer: >90 mL/min (ref >90)
Sodium: 135 mEq/L (ref 135–145)
Total Bilirubin: 1.1 mg/dL (ref 0.3–1.2)

## 2013-07-30 LAB — BLOOD GAS, ARTERIAL
Acid-Base Excess: 1.5 mmol/L (ref 0.0–2.0)
Patient temperature: 98.6
TCO2: 26 mmol/L (ref 0–100)

## 2013-07-30 LAB — SURGICAL PCR SCREEN: Staphylococcus aureus: NEGATIVE

## 2013-07-30 LAB — TYPE AND SCREEN
ABO/RH(D): O POS
Antibody Screen: NEGATIVE

## 2013-07-30 LAB — CBC
Platelets: 354 10*3/uL (ref 150–400)
RBC: 3.81 MIL/uL — ABNORMAL LOW (ref 4.22–5.81)
WBC: 8.3 10*3/uL (ref 4.0–10.5)

## 2013-07-30 LAB — PROTIME-INR: INR: 1.03 (ref 0.00–1.49)

## 2013-07-30 LAB — ABO/RH: ABO/RH(D): O POS

## 2013-07-30 LAB — MRSA PCR SCREENING: MRSA by PCR: NEGATIVE

## 2013-07-30 SURGERY — VIDEO ASSISTED THORACOSCOPY (VATS)/THOROCOTOMY
Anesthesia: General | Laterality: Right | Wound class: Clean Contaminated

## 2013-07-30 MED ORDER — OXYCODONE-ACETAMINOPHEN 5-325 MG PO TABS
1.0000 | ORAL_TABLET | ORAL | Status: DC | PRN
  Administered 2013-08-01 (×2): 2 via ORAL
  Filled 2013-07-30 (×2): qty 2

## 2013-07-30 MED ORDER — FENTANYL 10 MCG/ML IV SOLN
INTRAVENOUS | Status: DC
  Administered 2013-07-30: 21:00:00 via INTRAVENOUS
  Administered 2013-07-31: 225 ug via INTRAVENOUS
  Administered 2013-07-31: 75 ug via INTRAVENOUS
  Administered 2013-07-31: 11:00:00 via INTRAVENOUS
  Administered 2013-07-31: 285 ug via INTRAVENOUS
  Administered 2013-07-31: 190 ug via INTRAVENOUS
  Administered 2013-07-31: 04:00:00 via INTRAVENOUS
  Administered 2013-07-31: 322.5 ug via INTRAVENOUS
  Administered 2013-08-01: 10 ug via INTRAVENOUS
  Administered 2013-08-01 (×2): 130 ug via INTRAVENOUS
  Filled 2013-07-30 (×4): qty 50

## 2013-07-30 MED ORDER — ONDANSETRON HCL 4 MG/2ML IJ SOLN: 4.0000 mg | Freq: Four times a day (QID) | INTRAMUSCULAR | Status: DC | PRN

## 2013-07-30 MED ORDER — PANTOPRAZOLE SODIUM 40 MG PO TBEC
40.0000 mg | DELAYED_RELEASE_TABLET | Freq: Every day | ORAL | Status: DC
  Administered 2013-07-31 – 2013-08-01 (×2): 40 mg via ORAL
  Filled 2013-07-30 (×3): qty 1

## 2013-07-30 MED ORDER — ACETAMINOPHEN 500 MG PO TABS
1000.0000 mg | ORAL_TABLET | Freq: Four times a day (QID) | ORAL | Status: AC
  Administered 2013-07-30 – 2013-07-31 (×4): 1000 mg via ORAL
  Filled 2013-07-30 (×4): qty 2

## 2013-07-30 MED ORDER — NALOXONE HCL 0.4 MG/ML IJ SOLN: 0.4000 mg | INTRAMUSCULAR | Status: DC | PRN

## 2013-07-30 MED ORDER — ROCURONIUM BROMIDE 100 MG/10ML IV SOLN
INTRAVENOUS | Status: DC | PRN
  Administered 2013-07-30: 50 mg via INTRAVENOUS

## 2013-07-30 MED ORDER — SENNOSIDES-DOCUSATE SODIUM 8.6-50 MG PO TABS
1.0000 | ORAL_TABLET | Freq: Every evening | ORAL | Status: DC | PRN
  Administered 2013-08-01: 1 via ORAL
  Filled 2013-07-30: qty 1

## 2013-07-30 MED ORDER — OXYCODONE HCL 5 MG/5ML PO SOLN: 5.0000 mg | Freq: Once | ORAL | Status: DC | PRN

## 2013-07-30 MED ORDER — BISACODYL 5 MG PO TBEC
10.0000 mg | DELAYED_RELEASE_TABLET | Freq: Every day | ORAL | Status: DC
  Administered 2013-07-31: 10 mg via ORAL
  Filled 2013-07-30: qty 2

## 2013-07-30 MED ORDER — DEXTROSE 5 % IV SOLN
1.5000 g | INTRAVENOUS | Status: AC
  Administered 2013-07-30: 1.5 g via INTRAVENOUS
  Filled 2013-07-30: qty 1.5

## 2013-07-30 MED ORDER — DIPHENHYDRAMINE HCL 12.5 MG/5ML PO ELIX
12.5000 mg | ORAL_SOLUTION | Freq: Four times a day (QID) | ORAL | Status: DC | PRN
  Filled 2013-07-30: qty 5

## 2013-07-30 MED ORDER — KCL IN DEXTROSE-NACL 20-5-0.9 MEQ/L-%-% IV SOLN
INTRAVENOUS | Status: DC
  Administered 2013-07-30: 23:00:00 via INTRAVENOUS
  Administered 2013-07-31: 50 mL/h via INTRAVENOUS
  Filled 2013-07-30 (×5): qty 1000

## 2013-07-30 MED ORDER — DEXAMETHASONE SODIUM PHOSPHATE 4 MG/ML IJ SOLN
INTRAMUSCULAR | Status: DC | PRN
  Administered 2013-07-30: 4 mg via INTRAVENOUS

## 2013-07-30 MED ORDER — NEOSTIGMINE METHYLSULFATE 1 MG/ML IJ SOLN
INTRAMUSCULAR | Status: DC | PRN
  Administered 2013-07-30: 3 mg via INTRAVENOUS

## 2013-07-30 MED ORDER — LACTULOSE 10 GM/15ML PO SOLN
20.0000 g | Freq: Once | ORAL | Status: DC
  Filled 2013-07-30: qty 30

## 2013-07-30 MED ORDER — POTASSIUM CHLORIDE 10 MEQ/50ML IV SOLN: 10.0000 meq | Freq: Every day | INTRAVENOUS | Status: DC | PRN

## 2013-07-30 MED ORDER — OXYCODONE HCL 5 MG PO TABS
5.0000 mg | ORAL_TABLET | ORAL | Status: AC | PRN
  Administered 2013-07-31 (×2): 10 mg via ORAL
  Filled 2013-07-30 (×2): qty 2

## 2013-07-30 MED ORDER — LACTATED RINGERS IV SOLN
INTRAVENOUS | Status: DC | PRN
  Administered 2013-07-30 (×2): via INTRAVENOUS

## 2013-07-30 MED ORDER — SODIUM CHLORIDE 0.9 % IJ SOLN: 9.0000 mL | INTRAMUSCULAR | Status: DC | PRN

## 2013-07-30 MED ORDER — OXYCODONE HCL 5 MG PO TABS: 5.0000 mg | ORAL_TABLET | Freq: Once | ORAL | Status: DC | PRN

## 2013-07-30 MED ORDER — 0.9 % SODIUM CHLORIDE (POUR BTL) OPTIME
TOPICAL | Status: DC | PRN
  Administered 2013-07-30: 2000 mL

## 2013-07-30 MED ORDER — FENTANYL CITRATE 0.05 MG/ML IJ SOLN
INTRAMUSCULAR | Status: DC | PRN
  Administered 2013-07-30: 100 ug via INTRAVENOUS
  Administered 2013-07-30: 50 ug via INTRAVENOUS
  Administered 2013-07-30: 100 ug via INTRAVENOUS

## 2013-07-30 MED ORDER — DEXTROSE 5 % IV SOLN
INTRAVENOUS | Status: AC
  Filled 2013-07-30: qty 50

## 2013-07-30 MED ORDER — CEFUROXIME SODIUM 1.5 G IJ SOLR
1.5000 g | Freq: Two times a day (BID) | INTRAMUSCULAR | Status: AC
  Administered 2013-07-31 (×2): 1.5 g via INTRAVENOUS
  Filled 2013-07-30 (×2): qty 1.5

## 2013-07-30 MED ORDER — GLYCOPYRROLATE 0.2 MG/ML IJ SOLN
INTRAMUSCULAR | Status: DC | PRN
  Administered 2013-07-30: 0.4 mg via INTRAVENOUS

## 2013-07-30 MED ORDER — MIDAZOLAM HCL 5 MG/5ML IJ SOLN
INTRAMUSCULAR | Status: DC | PRN
  Administered 2013-07-30: 2 mg via INTRAVENOUS

## 2013-07-30 MED ORDER — CEFUROXIME SODIUM 1.5 G IJ SOLR
INTRAMUSCULAR | Status: AC
  Filled 2013-07-30: qty 1.5

## 2013-07-30 MED ORDER — SUFENTANIL CITRATE 50 MCG/ML IV SOLN
INTRAVENOUS | Status: DC | PRN
Start: 2013-07-30 — End: 2013-07-30
  Administered 2013-07-30: 20 ug via INTRAVENOUS
  Administered 2013-07-30 (×3): 10 ug via INTRAVENOUS

## 2013-07-30 MED ORDER — HYDROMORPHONE HCL PF 1 MG/ML IJ SOLN
0.2500 mg | INTRAMUSCULAR | Status: DC | PRN
  Administered 2013-07-30: 1 mg via INTRAVENOUS

## 2013-07-30 MED ORDER — LIDOCAINE HCL (CARDIAC) 20 MG/ML IV SOLN
INTRAVENOUS | Status: DC | PRN
  Administered 2013-07-30: 100 mg via INTRAVENOUS

## 2013-07-30 MED ORDER — ACETAMINOPHEN 160 MG/5ML PO SOLN
1000.0000 mg | Freq: Four times a day (QID) | ORAL | Status: AC
  Filled 2013-07-30 (×4): qty 40

## 2013-07-30 MED ORDER — ONDANSETRON HCL 4 MG/2ML IJ SOLN
INTRAMUSCULAR | Status: DC | PRN
  Administered 2013-07-30: 4 mg via INTRAVENOUS

## 2013-07-30 MED ORDER — TRAMADOL HCL 50 MG PO TABS: 50.0000 mg | ORAL_TABLET | Freq: Four times a day (QID) | ORAL | Status: DC | PRN

## 2013-07-30 MED ORDER — PROPOFOL 10 MG/ML IV BOLUS
INTRAVENOUS | Status: DC | PRN
  Administered 2013-07-30: 160 mg via INTRAVENOUS
  Administered 2013-07-30: 40 mg via INTRAVENOUS

## 2013-07-30 MED ORDER — HYDROMORPHONE HCL PF 1 MG/ML IJ SOLN
INTRAMUSCULAR | Status: AC
  Filled 2013-07-30: qty 1

## 2013-07-30 MED ORDER — DIPHENHYDRAMINE HCL 50 MG/ML IJ SOLN: 12.5000 mg | Freq: Four times a day (QID) | INTRAMUSCULAR | Status: DC | PRN

## 2013-07-30 MED ORDER — ONDANSETRON HCL 4 MG/2ML IJ SOLN: 4.0000 mg | Freq: Once | INTRAMUSCULAR | Status: DC | PRN

## 2013-07-30 SURGICAL SUPPLY — 62 items
ADH SKN CLS APL DERMABOND .7 (GAUZE/BANDAGES/DRESSINGS) ×1
APL SRG 22X2 LUM MLBL SLNT (VASCULAR PRODUCTS)
APPLICATOR TIP EXT COSEAL (VASCULAR PRODUCTS) IMPLANT
APPLIER CLIP ROT 10 11.4 M/L (STAPLE)
APR CLP MED LRG 11.4X10 (STAPLE)
CANISTER SUCTION 2500CC (MISCELLANEOUS) ×2 IMPLANT
CATH KIT ON Q 5IN SLV (PAIN MANAGEMENT) IMPLANT
CATH THORACIC 28FR (CATHETERS) IMPLANT
CATH THORACIC 36FR (CATHETERS) IMPLANT
CATH THORACIC 36FR RT ANG (CATHETERS) IMPLANT
CLIP APPLIE ROT 10 11.4 M/L (STAPLE) IMPLANT
CLIP TI MEDIUM 6 (CLIP) ×2 IMPLANT
CLOTH BEACON ORANGE TIMEOUT ST (SAFETY) ×2 IMPLANT
CONT SPEC 4OZ CLIKSEAL STRL BL (MISCELLANEOUS) ×4 IMPLANT
COVER SURGICAL LIGHT HANDLE (MISCELLANEOUS) ×2 IMPLANT
DERMABOND ADVANCED (GAUZE/BANDAGES/DRESSINGS) ×1
DERMABOND ADVANCED .7 DNX12 (GAUZE/BANDAGES/DRESSINGS) IMPLANT
DRAPE LAPAROSCOPIC ABDOMINAL (DRAPES) ×2 IMPLANT
DRAPE WARM FLUID 44X44 (DRAPE) ×2 IMPLANT
ELECT REM PT RETURN 9FT ADLT (ELECTROSURGICAL) ×2
ELECTRODE REM PT RTRN 9FT ADLT (ELECTROSURGICAL) ×1 IMPLANT
FLUID NSS /IRRIG 3000 ML XXX (IV SOLUTION) IMPLANT
GLOVE BIO SURGEON STRL SZ 6 (GLOVE) ×1 IMPLANT
GLOVE BIOGEL PI IND STRL 6.5 (GLOVE) IMPLANT
GLOVE BIOGEL PI INDICATOR 6.5 (GLOVE) ×2
GLOVE EUDERMIC 7 POWDERFREE (GLOVE) ×4 IMPLANT
GOWN STRL NON-REIN LRG LVL3 (GOWN DISPOSABLE) ×4 IMPLANT
KIT BASIN OR (CUSTOM PROCEDURE TRAY) ×2 IMPLANT
KIT ROOM TURNOVER OR (KITS) ×2 IMPLANT
NS IRRIG 1000ML POUR BTL (IV SOLUTION) ×4 IMPLANT
PACK CHEST (CUSTOM PROCEDURE TRAY) ×2 IMPLANT
PAD ARMBOARD 7.5X6 YLW CONV (MISCELLANEOUS) ×4 IMPLANT
SEALANT SURG COSEAL 4ML (VASCULAR PRODUCTS) IMPLANT
SEALANT SURG COSEAL 8ML (VASCULAR PRODUCTS) IMPLANT
SET IRRIG TUBING LAPAROSCOPIC (IRRIGATION / IRRIGATOR) ×2 IMPLANT
SOLUTION ANTI FOG 6CC (MISCELLANEOUS) ×2 IMPLANT
SPONGE GAUZE 4X4 12PLY (GAUZE/BANDAGES/DRESSINGS) ×2 IMPLANT
SUT MNCRL AB 4-0 PS2 18 (SUTURE) ×1 IMPLANT
SUT PROLENE 3 0 SH DA (SUTURE) IMPLANT
SUT PROLENE 4 0 RB 1 (SUTURE)
SUT PROLENE 4-0 RB1 .5 CRCL 36 (SUTURE) IMPLANT
SUT SILK  1 MH (SUTURE) ×3
SUT SILK 1 MH (SUTURE) ×2 IMPLANT
SUT SILK 2 0SH CR/8 30 (SUTURE) IMPLANT
SUT VIC AB 1 CTX 18 (SUTURE) IMPLANT
SUT VIC AB 1 CTX 36 (SUTURE) ×2
SUT VIC AB 1 CTX36XBRD ANBCTR (SUTURE) IMPLANT
SUT VIC AB 2-0 CTX 27 (SUTURE) ×1 IMPLANT
SUT VIC AB 2-0 CTX 36 (SUTURE) ×1 IMPLANT
SUT VIC AB 2-0 UR6 27 (SUTURE) ×1 IMPLANT
SUT VIC AB 3-0 SH 8-18 (SUTURE) ×1 IMPLANT
SUT VIC AB 3-0 X1 27 (SUTURE) ×2 IMPLANT
SUT VICRYL 2 TP 1 (SUTURE) ×1 IMPLANT
SYSTEM SAHARA CHEST DRAIN RE-I (WOUND CARE) ×2 IMPLANT
TAPE CLOTH 4X10 WHT NS (GAUZE/BANDAGES/DRESSINGS) ×2 IMPLANT
TAPE CLOTH SURG 4X10 WHT LF (GAUZE/BANDAGES/DRESSINGS) ×1 IMPLANT
TIP APPLICATOR SPRAY EXTEND 16 (VASCULAR PRODUCTS) IMPLANT
TOWEL OR 17X24 6PK STRL BLUE (TOWEL DISPOSABLE) ×4 IMPLANT
TOWEL OR 17X26 10 PK STRL BLUE (TOWEL DISPOSABLE) ×4 IMPLANT
TRAP SPECIMEN MUCOUS 40CC (MISCELLANEOUS) ×1 IMPLANT
TRAY FOLEY CATH 14FRSI W/METER (CATHETERS) ×2 IMPLANT
WATER STERILE IRR 1000ML POUR (IV SOLUTION) ×4 IMPLANT

## 2013-07-30 NOTE — Brief Op Note (Addendum)
07/28/2013 - 07/30/2013  7:57 PM  PATIENT:  Jordan Baldwin  67 y.o. male  PRE-OPERATIVE DIAGNOSIS:  Right hemothorax  POST-OPERATIVE DIAGNOSIS:  Right hemothorax  PROCEDURE:  RIGHT VIDEO ASSISTED THORACOSCOPY (VATS), RIGHT MINI THOROCOTOMY and evacuation of hemothorax   FINDINGS: Approximately 1600 cc of dark blood evacuated  SURGEON:  Surgeon(s) and Role:    * Purcell Nails, MD - Primary  PHYSICIAN ASSISTANT: Doree Fudge PA-C   ANESTHESIA:   general  EBL:  trivial  BLOOD ADMINISTERED:none  DRAINS: One Chest Tube(s) in the Right pleural space   SPECIMEN:  Source of Specimen:  Right pleural fluid  DISPOSITION OF SPECIMEN:  Culture  COUNTS CORRECT:  YES  DICTATION: .Dragon Dictation  PLAN OF CARE: Admit to inpatient   PATIENT DISPOSITION:  PACU - hemodynamically stable.   Delay start of Pharmacological VTE agent (>24hrs) due to surgical blood loss or risk of bleeding: yes  [name],[name] 07/30/2013 8:16 PM

## 2013-07-30 NOTE — Anesthesia Procedure Notes (Signed)
Procedure Name: Intubation Date/Time: 07/30/2013 7:06 PM Performed by: Melina Schools Pre-anesthesia Checklist: Patient identified, Suction available and Patient being monitored Patient Re-evaluated:Patient Re-evaluated prior to inductionPreoxygenation: Pre-oxygenation with 100% oxygen Intubation Type: IV induction and Cricoid Pressure applied Ventilation: Mask ventilation without difficulty and Oral airway inserted - appropriate to patient size Laryngoscope Size: Mac and 4 Grade View: Grade I Tube type: MLT Endobronchial tube: Double lumen EBT, Bronchial Blocker placed under direct vision, EBT position confirmed by auscultation and EBT position confirmed by fiberoptic bronchoscope and 37 Fr Number of attempts: 1 Airway Equipment and Method: Stylet and Oral airway Placement Confirmation: ETT inserted through vocal cords under direct vision,  positive ETCO2 and breath sounds checked- equal and bilateral Secured at: 37 cm Tube secured with: Tape Dental Injury: Teeth and Oropharynx as per pre-operative assessment

## 2013-07-30 NOTE — Transfer of Care (Signed)
Immediate Anesthesia Transfer of Care Note  Patient: Jordan Baldwin  Procedure(s) Performed: Procedure(s) with comments: VIDEO ASSISTED THORACOSCOPY (VATS)/THOROCOTOMY (Right) - evacuation of hemothorax  Patient Location: PACU  Anesthesia Type:General  Level of Consciousness: awake, oriented, patient cooperative and responds to stimulation  Airway & Oxygen Therapy: Patient Spontanous Breathing and Patient connected to face mask oxygen  Post-op Assessment: Report given to PACU RN, Post -op Vital signs reviewed and stable and Patient moving all extremities X 4  Post vital signs: Reviewed and stable  Complications: No apparent anesthesia complications

## 2013-07-30 NOTE — Progress Notes (Signed)
TCTS BRIEF PROGRESS NOTE   CT scan demonstrates at least a moderate amount of residual clot in the right pleural space that has not come out of the chest tube despite excellent tube position.  I have discussed the indications, risks and potential benefits of right VATS and possible thoracotomy for evacuation of residual hemothorax with Jordan Baldwin and Jordan Baldwin.  The patient understands and accepts all potential associated risks of surgery including but not limited to risk of death, stroke, myocardial infarction, respiratory failure, bleeding requiring blood transfusion and/or reexploration, arrhythmia, pneumonia, pleural effusion, wound infection, pulmonary embolus or other thromboembolic complication, chronic pain or other delayed complications.  All questions answered.  We plan to proceed directly to the OR this evening.   Jordan Baldwin H 07/30/2013 5:24 PM

## 2013-07-30 NOTE — Discharge Summary (Signed)
Physician Discharge Summary  Patient ID: Jordan Baldwin MRN: [id number] DOB/AGE: [birth year] 67 y.o.  Admit date: 07/28/2013 Discharge date: 07/30/2013  Admission Diagnoses:  Patient Active Problem List   Diagnosis Date Noted  . Hemothorax on right 07/28/2013  . Fracture of rib of right side 07/28/2013  . Atelectasis of right lung 07/28/2013  . OVERWEIGHT 10/18/2010  . AORTIC INSUFFICIENCY 10/18/2010  . NEOPLASM, SKIN 11/29/2008  . HYPERLIPIDEMIA 11/29/2008  . ANEMIA-NOS 11/29/2008  . HYPERTENSION 11/29/2008  . PEPTIC ULCER DISEASE 11/29/2008  . IBS 11/29/2008  . RENAL CALCULUS, HX OF 11/29/2008  . ARTHROSCOPY, RIGHT KNEE, HX OF 11/29/2008   Discharge Diagnoses:   Patient Active Problem List   Diagnosis Date Noted  . Hemothorax on right 07/28/2013  . Fracture of rib of right side 07/28/2013  . Atelectasis of right lung 07/28/2013  . OVERWEIGHT 10/18/2010  . AORTIC INSUFFICIENCY 10/18/2010  . NEOPLASM, SKIN 11/29/2008  . HYPERLIPIDEMIA 11/29/2008  . ANEMIA-NOS 11/29/2008  . HYPERTENSION 11/29/2008  . PEPTIC ULCER DISEASE 11/29/2008  . IBS 11/29/2008  . RENAL CALCULUS, HX OF 11/29/2008  . ARTHROSCOPY, RIGHT KNEE, HX OF 11/29/2008   Discharged Condition: good  History of Present Illness:   Mr. Jordan Baldwin is a 67 yo white male who was referred to the Emergency Department for evaluation of a Right sided Pleural effusion. The patient states that on July 22 he was doing some repair work on his [employer] in [place]. He suffered a fall on his right sided. He presented to the local Emergency Department at which time he was diagnosed with a broken tenth rib on his right side. He was given some pain medication and instructed to follow up with his PCP in one week. He presented for follow up today at which time the patient complained of some tightness and shortness of breath with deep inspiration. CXR was obtained and showed a moderate sized Right sided Pleural effusion. Due to his  recent fall he was referred to [facility] Emergency Department for further evaluation. Upon arrival he underwent further workup with a Non Contrast CT scan which confirmed the presence of a Right sided pneumothorax. It was felt this was most likely a Hemothorax due to his recent fall and subsequent rib fractures. TCTS consult was placed.  The patient was evaluated by Dr. Cornelius Moras, his CT scan was reviewed and patient was admitted for subsequent Chest tube placement.   Hospital Course:   The patient underwent Right sided chest tube placement.  This resulted in evacuation of 300 cc of blood tinged fluid immediately.  The patient tolerated the procedure without difficultly.  His hospital stay has been uncomplicated.  Chest tube has continued to drain the remaining portion of the hemothorax.  The patient's hemoglobin level has remained stable.Jordan Baldwin  He underwent repeat CT scan of the chest which revealed loculated hemothorax.  It was felt the patient would benefit from VATS procedure.  This was performed 07/30/2013.  The patient tolerated the procedure and was extubated prior to leaving the operating room.  The patient continued to progress.  Once chest tube output decreased and no air leak was present.  The patients chest tube was removed without difficulty.  Of note the patient has a history of hypertension and hyperlipidemia.  He used to take medications for this however, he states that did not make him feel all that great, so he stopped them.  The patient's blood pressure has been high during his hospitalization running into the 160 range.  The patient has been encouraged to follow up with his PCP for a new medication regimen for his hypertension.  Should no further issues arise we anticipate discharge in the next 24 hours.  He will need to follow up with Dr. Cornelius Moras in 2 weeks with a CXR prior to his appointment.  He will also need to follow up with his PCP.    Significant Diagnostic Studies: radiology: CT scan:     Chest tube on the right is present with the side port  slightly lateral to the right pleural surface. Minimal  subcutaneous air is seen on the right.  Loculated effusion, consistent with hemorrhagic fluid, is seen on  the right. There is right lower lobe consolidation with patchy  consolidation in the right middle lobe. There is patchy  atelectasis inferiorly on the left.  There is a displaced fracture of the right tenth rib posteriorly.  No pneumothorax. No adenopathy.  Treatments: chest tube placement on right side  Disposition: Home  Discharge Medications:    Medication List         ferrous sulfate 325 (65 FE) MG tablet  Take 1 tablet (325 mg total) by mouth 2 (two) times daily with a meal.     ibuprofen 200 MG tablet  Commonly known as:  ADVIL,MOTRIN  Take 200 mg by mouth every 6 (six) hours as needed for pain.     oxyCODONE-acetaminophen 5-325 MG per tablet  Commonly known as:  PERCOCET/ROXICET  Take 1-2 tablets by mouth every 6 (six) hours as needed.            Follow-up Information   Follow up with Purcell Nails, MD.   Contact information:   [address] [place] [address] [phone]       Follow up with [facility].   Contact information:   [place]       Follow up with Marga Melnick, MD. (Please make follow up appointment for Hypertension )    Contact information:   [address] [facility] [address] [place] [address] [phone]       Signed: Lowella Dandy 07/30/2013, 8:49 AM

## 2013-07-30 NOTE — Progress Notes (Addendum)
      [address]       [place] [address]             [phone]            Subjective: Patient with a lot of gas this am  Objective: Vital signs in last 24 hours: Temp:  [98.1 F (36.7 C)-99.3 F (37.4 C)] 98.7 F (37.1 C) (07/31 0436) Pulse Rate:  [56-66] 66 (07/31 0436) Cardiac Rhythm:  [-] Normal sinus rhythm (07/30 1940) Resp:  [18-20] 18 (07/31 0436) BP: (129-140)/(66-74) 135/69 mmHg (07/31 0436) SpO2:  [91 %-98 %] 91 % (07/31 0436)     Intake/Output from previous day: 07/30 0701 - 07/31 0700 In: 240 [P.O.:240] Out: 10 [Chest Tube:10]   Physical Exam:  Cardiovascular: RRR Pulmonary: Clear to auscultation on left and diminished at right base; no rales, wheezes, or rhonchi. Abdomen: Soft, distended,non tender, bowel sounds present. Extremities: No lower extremity edema. Wounds: Dressing is clean and dry. Chest Tube: Dark bloody output, to suction and no air leak  Lab Results: CBC:  Recent Labs  07/28/13 1356  WBC 5.9  HGB 10.8*  HCT 31.7*  PLT 298   BMET:   Recent Labs  07/28/13 1356  NA 138  K 3.9  CL 103  CO2 26  GLUCOSE 103*  BUN 15  CREATININE 0.79  CALCIUM 9.3    PT/INR: No results found for this basename: LABPROT, INR,  in the last 72 hours ABG:  INR: Will add last result for INR, ABG once components are confirmed Will add last 4 CBG results once components are confirmed  Assessment/Plan:  1. CV - Hypertensive. Patient states was previously on medicine but stopped taking due to side effects. Will need to follow up with medical doctor. 2.  Pulmonary - Chest tube with scant output (10 cc). Chest tube is to suction and no air leak.Likely remove chest tube.CXR this am shows no pneumothorax, side port of ct at rib cage, low lung volumes, atelectasis and fluid on right, and left base atelectasis. Continue to use incentive spirometer.Preliminary right pleural fluid shows no organisms seen. Check CXR in am. 3.LOC  constipation 4.Possible discharge in am  ZIMMERMAN,Jordan Baldwin 07/30/2013,7:55 AM   I have seen and examined the patient and agree with the assessment and plan as outlined.  CXR interpreted by Radiologist as demonstrating "moderate" pleural effusion despite good chest tube position.  This may be residual clot or simply atelectasis.  Will get CT scan and d/c tube if there is not a sizeable amount of residual clot.  Jordan Baldwin H 07/30/2013 8:07 AM

## 2013-07-30 NOTE — Preoperative (Signed)
Beta Blockers   Reason not to administer Beta Blockers:Not Applicable 

## 2013-07-30 NOTE — Op Note (Signed)
CARDIOTHORACIC SURGERY OPERATIVE NOTE  Date of Procedure:   07/30/2013  Preoperative Diagnosis:  Right Hemothorax  Postoperative Diagnosis:  same  Procedure:    Right Video-assisted Thoracoscopy for Drainage of Hemothorax   Surgeon:    Salvatore Decent. Cornelius Moras, MD  Assistant:    Ardelle Balls, PA-C  Anesthesia:    Sheldon Silvan, MD  Operative Findings:   Approximately 1600 mL of old blood  Grossly normal appearing lung and chest wall     BRIEF CLINICAL NOTE AND INDICATIONS FOR SURGERY  Jordan Baldwin is a 67 yo white male who was referred to the Emergency Department for evaluation of a Right sided Pleural effusion. The patient states that on July 22 he was doing some repair work on his [employer] in [place]. He suffered a fall on his right sided. He presented to the local Emergency Department at which time he was diagnosed with a broken tenth rib on his right side. He was given some pain medication and instructed to follow up with his PCP in one week. He presented for follow up today at which time the patient complained of some tightness and shortness of breath with deep inspiration. CXR was obtained and showed a moderate sized Right sided Pleural effusion. Due to his recent fall he was referred to [facility] Emergency Department for further evaluation. A chest tube was placed at that time yielding over 300 mL of blood. However, over the subsequent 48 hours only a small amount of additional blood drained from the chest tube despite excellent to position verified on postoperative chest radiograph. Repeat CT scan demonstrates sizable residual amount of blood in the right chest. Patient is counseled regarding treatment alternatives and a decision is made toa proceed to the operating room for definitive drainage.      DETAILS OF THE OPERATIVE PROCEDURE  The patient is brought to the operating room on the above mentioned date And placed in the supine position on the operating table.  Central venous line and radial arterial line are placed by the anesthesia team. Intravenous antibiotics are administered and pneumatic sequential compression boots are placed on both lower extremities. General endotracheal anesthesia is induced uneventfully.  The patient was intubated using a dual lumen endotracheal tube. The patient is turned to the left lateral decubitus position.  An axillary roll is placed and the patient's pre-existing chest tube is removed.  The right chest was prepared and draped in a sterile manner.  The small incision in the right posterolateral chest wall from previous chest tube is utilized as a thoracoscopic port. The 10 mm port was passed through the incision in the right chest was explored visually. There is a moderate amount of clot which completely obscured visibility. The port is removed and the incision is extended for several centimeters. The clot is evacuated.  A blunt sucker tip is placed into the pleural space to evacuate pleural fluid. A total of 1600 mL of old dark bloody fluid is evacuated and a portion of the fluid is trapped to be sent for cytology.  The right pleural space and subsequently irrigated using a total of 2 L of warm saline solution. Following this the thoracoscopic camera was reintroduced into the chest.  The pleural space is examined visually.  The appearance of the right lung was normal. There is no significant residual clot. There are no abnormalities on the surface of the diaphragm nor the chest wall.  The pleural space is drained using a single 36 [place]  chest tube exited through a separate stab incision. The remaining incision is closed in multiple layers in routine fashion. The chest tube is fixed to close suction drainage device.  The patient tolerated the procedure well, was extubated in the operative room, and transported to the postanesthesia care in stable condition. All sponge instrument and needle counts are verified correct. There were no  intraoperative complications. Estimated blood loss was trivial.      Salvatore Decent. Cornelius Moras MD 07/30/2013 8:17 PM  07/30/2013 8:17 PM

## 2013-07-30 NOTE — Anesthesia Postprocedure Evaluation (Signed)
  Anesthesia Post-op Note  Patient: Jordan Baldwin  Procedure(s) Performed: Procedure(s) with comments: VIDEO ASSISTED THORACOSCOPY (VATS)/THOROCOTOMY (Right) - evacuation of hemothorax  Patient Location: PACU  Anesthesia Type:General  Level of Consciousness: awake, alert  and oriented  Airway and Oxygen Therapy: Patient Spontanous Breathing and Patient connected to nasal cannula oxygen  Post-op Pain: mild  Post-op Assessment: Post-op Vital signs reviewed  Post-op Vital Signs: Reviewed  Complications: No apparent anesthesia complications

## 2013-07-30 NOTE — Care Management Note (Unsigned)
    Page 1 of 1   07/30/2013     5:01:14 PM   CARE MANAGEMENT NOTE 07/30/2013  Patient:  Jordan Baldwin, Jordan Baldwin   Account Number:  [id number]  Date Initiated:  07/30/2013  Documentation initiated by:  Janthony Holleman  Subjective/Objective Assessment:   PT ADM WITH HEMOTHORAX S/P FALL.  PTA, PT INDEPENDENT, LIVES WITH SPOUSE.     Action/Plan:   WILL FOLLOW FOR HOME NEEDS AS PT PROGRESSES.   Anticipated DC Date:  07/31/2013   Anticipated DC Plan:  HOME/SELF CARE      DC Planning Services  CM consult      Choice offered to / List presented to:             Status of service:  In process, will continue to follow Medicare Important Message given?   (If response is "NO", the following Medicare IM given date fields will be blank) Date Medicare IM given:   Date Additional Medicare IM given:    Discharge Disposition:    Per UR Regulation:  Reviewed for med. necessity/level of care/duration of stay  If discussed at Long Length of Stay Meetings, dates discussed:    Comments:

## 2013-07-30 NOTE — Anesthesia Preprocedure Evaluation (Signed)
Anesthesia Evaluation  Patient identified by MRN, date of birth, ID band Patient awake    Reviewed: Allergy & Precautions, H&P , NPO status , Patient's Chart, lab work & pertinent test results  Airway Mallampati: I TM Distance: >3 FB Neck ROM: Full    Dental  (+) Teeth Intact and Dental Advisory Given   Pulmonary  breath sounds clear to auscultation        Cardiovascular hypertension, Rhythm:Regular Rate:Normal     Neuro/Psych    GI/Hepatic   Endo/Other    Renal/GU      Musculoskeletal   Abdominal   Peds  Hematology   Anesthesia Other Findings   Reproductive/Obstetrics                           Anesthesia Physical Anesthesia Plan  ASA: II  Anesthesia Plan: General   Post-op Pain Management:    Induction: Intravenous  Airway Management Planned: Double Lumen EBT  Additional Equipment: Arterial line  Intra-op Plan:   Post-operative Plan: Extubation in OR  Informed Consent: I have reviewed the patients History and Physical, chart, labs and discussed the procedure including the risks, benefits and alternatives for the proposed anesthesia with the patient or authorized representative who has indicated his/her understanding and acceptance.   Dental advisory given  Plan Discussed with: CRNA, Anesthesiologist and Surgeon  Anesthesia Plan Comments:         Anesthesia Quick Evaluation

## 2013-07-31 ENCOUNTER — Encounter (HOSPITAL_COMMUNITY): Payer: Self-pay | Admitting: Anesthesiology

## 2013-07-31 ENCOUNTER — Encounter (HOSPITAL_COMMUNITY)
Admission: EM | Disposition: A | Discharge status: Home / Self Care | Payer: Self-pay | Source: Home / Self Care | Attending: Thoracic Surgery (Cardiothoracic Vascular Surgery)

## 2013-07-31 ENCOUNTER — Encounter (HOSPITAL_COMMUNITY): Payer: Self-pay | Admitting: Thoracic Surgery (Cardiothoracic Vascular Surgery)

## 2013-07-31 ENCOUNTER — Inpatient Hospital Stay (HOSPITAL_COMMUNITY): Payer: Medicare Other

## 2013-07-31 LAB — URINALYSIS, ROUTINE W REFLEX MICROSCOPIC
Glucose, UA: NEGATIVE mg/dL
Hgb urine dipstick: NEGATIVE
Specific Gravity, Urine: 1.031 — ABNORMAL HIGH (ref 1.005–1.030)
Urobilinogen, UA: 1 mg/dL (ref 0.0–1.0)

## 2013-07-31 LAB — CBC
Platelets: 294 10*3/uL (ref 150–400)
RBC: 3.42 MIL/uL — ABNORMAL LOW (ref 4.22–5.81)
RDW: 12.1 % (ref 11.5–15.5)
WBC: 8.1 10*3/uL (ref 4.0–10.5)

## 2013-07-31 LAB — BASIC METABOLIC PANEL
Calcium: 8.3 mg/dL — ABNORMAL LOW (ref 8.4–10.5)
GFR calc Af Amer: >90 mL/min (ref >90)
GFR calc non Af Amer: >90 mL/min (ref >90)
Potassium: 4.2 mEq/L (ref 3.5–5.1)
Sodium: 133 mEq/L — ABNORMAL LOW (ref 135–145)

## 2013-07-31 LAB — BLOOD GAS, ARTERIAL
Patient temperature: 98.6
pCO2 arterial: 47.2 mmHg — ABNORMAL HIGH (ref 35.0–45.0)
pH, Arterial: 7.381 (ref 7.350–7.450)

## 2013-07-31 SURGERY — CANCELLED PROCEDURE

## 2013-07-31 SURGICAL SUPPLY — 54 items
BAG DECANTER FOR FLEXI CONT (MISCELLANEOUS) IMPLANT
CANISTER SUCTION 2500CC (MISCELLANEOUS) ×3 IMPLANT
CATH KIT ON Q 5IN SLV (PAIN MANAGEMENT) IMPLANT
CATH THORACIC 28FR (CATHETERS) IMPLANT
CATH THORACIC 28FR RT ANG (CATHETERS) IMPLANT
CATH THORACIC 36FR (CATHETERS) IMPLANT
CATH THORACIC 36FR RT ANG (CATHETERS) IMPLANT
CHERRY SPONGEY 1/2 (GAUZE/BANDAGES/DRESSINGS) IMPLANT
CLOTH BEACON ORANGE TIMEOUT ST (SAFETY) ×3 IMPLANT
CONT SPEC 4OZ CLIKSEAL STRL BL (MISCELLANEOUS) ×6 IMPLANT
COVER SURGICAL LIGHT HANDLE (MISCELLANEOUS) ×6 IMPLANT
DRAPE LAPAROSCOPIC ABDOMINAL (DRAPES) ×3 IMPLANT
DRAPE WARM FLUID 44X44 (DRAPE) ×6 IMPLANT
ELECT REM PT RETURN 9FT ADLT (ELECTROSURGICAL) ×2
ELECTRODE REM PT RTRN 9FT ADLT (ELECTROSURGICAL) ×2 IMPLANT
GLOVE ORTHO TXT STRL SZ7.5 (GLOVE) ×6 IMPLANT
GOWN STRL NON-REIN LRG LVL3 (GOWN DISPOSABLE) ×6 IMPLANT
HEMOSTAT SURGICEL 2X14 (HEMOSTASIS) IMPLANT
KIT BASIN OR (CUSTOM PROCEDURE TRAY) ×3 IMPLANT
KIT ROOM TURNOVER OR (KITS) ×3 IMPLANT
KIT SUCTION CATH 14FR (SUCTIONS) ×3 IMPLANT
NS IRRIG 1000ML POUR BTL (IV SOLUTION) ×6 IMPLANT
PACK CHEST (CUSTOM PROCEDURE TRAY) ×3 IMPLANT
PAD ARMBOARD 7.5X6 YLW CONV (MISCELLANEOUS) ×6 IMPLANT
SEALANT SURG COSEAL 4ML (VASCULAR PRODUCTS) IMPLANT
SOLUTION ANTI FOG 6CC (MISCELLANEOUS) ×3 IMPLANT
SPECIMEN JAR LG PLASTIC EMPTY (MISCELLANEOUS) IMPLANT
SPECIMEN JAR MEDIUM (MISCELLANEOUS) ×3 IMPLANT
SPONGE GAUZE 4X4 12PLY (GAUZE/BANDAGES/DRESSINGS) ×3 IMPLANT
STAPLER VISISTAT 35W (STAPLE) IMPLANT
SUT PROLENE 3 0 SH DA (SUTURE) IMPLANT
SUT PROLENE 4 0 RB 1 (SUTURE)
SUT PROLENE 4-0 RB1 .5 CRCL 36 (SUTURE) IMPLANT
SUT SILK  1 MH (SUTURE) ×1
SUT SILK 1 MH (SUTURE) ×2 IMPLANT
SUT SILK 2 0SH CR/8 30 (SUTURE) IMPLANT
SUT SILK 3 0SH CR/8 30 (SUTURE) IMPLANT
SUT VIC AB 2-0 CT1 27 (SUTURE)
SUT VIC AB 2-0 CT1 TAPERPNT 27 (SUTURE) IMPLANT
SUT VIC AB 2-0 CTX 36 (SUTURE) IMPLANT
SUT VIC AB 3-0 MH 27 (SUTURE) IMPLANT
SUT VIC AB 3-0 SH 27 (SUTURE)
SUT VIC AB 3-0 SH 27X BRD (SUTURE) IMPLANT
SUT VICRYL 0 UR6 27IN ABS (SUTURE) ×3 IMPLANT
SUT VICRYL 2 TP 1 (SUTURE) IMPLANT
SWAB COLLECTION DEVICE MRSA (MISCELLANEOUS) IMPLANT
SYSTEM SAHARA CHEST DRAIN ATS (WOUND CARE) ×3 IMPLANT
TAPE CLOTH 4X10 WHT NS (GAUZE/BANDAGES/DRESSINGS) ×3 IMPLANT
TOWEL OR 17X24 6PK STRL BLUE (TOWEL DISPOSABLE) ×3 IMPLANT
TOWEL OR 17X26 10 PK STRL BLUE (TOWEL DISPOSABLE) ×6 IMPLANT
TRAP SPECIMEN MUCOUS 40CC (MISCELLANEOUS) IMPLANT
TRAY FOLEY CATH 14FRSI W/METER (CATHETERS) IMPLANT
TUBE ANAEROBIC SPECIMEN COL (MISCELLANEOUS) IMPLANT
WATER STERILE IRR 1000ML POUR (IV SOLUTION) ×6 IMPLANT

## 2013-07-31 NOTE — Progress Notes (Addendum)
TCTS DAILY ICU PROGRESS NOTE                   [address]            [place] [address]          [phone]   1 Day Post-Op Procedure(s) (LRB): VIDEO ASSISTED THORACOSCOPY (VATS)/THOROCOTOMY (Right)  Total Length of Stay:  LOS: 3 days   Subjective: Reasonably comfortable with PCA   Objective: Vital signs in last 24 hours: Temp:  [98 F (36.7 C)-99.9 F (37.7 C)] 98.2 F (36.8 C) (08/01 0736) Pulse Rate:  [62-84] 63 (08/01 0400) Cardiac Rhythm:  [-] Normal sinus rhythm (08/01 0400) Resp:  [12-26] 17 (08/01 0842) BP: (120-179)/(61-88) 140/71 mmHg (08/01 0736) SpO2:  [70 %-100 %] 95 % (08/01 0842) Arterial Line BP: (152-227)/(61-75) 152/61 mmHg (08/01 0400) Weight:  [246 lb 7.6 oz (111.8 kg)] 246 lb 7.6 oz (111.8 kg) (07/31 2156)  Filed Weights   07/28/13 1811 07/30/13 2156  Weight: 246 lb 12.8 oz (111.948 kg) 246 lb 7.6 oz (111.8 kg)    Weight change:    Hemodynamic parameters for last 24 hours:    Intake/Output from previous day: 07/31 0701 - 08/01 0700 In: 2790 [P.O.:240; I.V.:2500; IV Piggyback:50] Out: 671 [Urine:601; Chest Tube:70]  Intake/Output this shift: Total I/O In: -  Out: 150 [Urine:150]  Current Meds: Scheduled Meds: . acetaminophen  1,000 mg Oral Q6H   Or  . acetaminophen (TYLENOL) oral liquid 160 mg/5 mL  1,000 mg Oral Q6H  . bisacodyl  10 mg Oral Daily  . cefUROXime (ZINACEF)  IV  1.5 g Intravenous Q12H  . fentaNYL   Intravenous Q4H  . pantoprazole  40 mg Oral Daily   Continuous Infusions: . dextrose 5 % and 0.9 % NaCl with KCl 20 mEq/L 100 mL/hr at 07/30/13 2247   PRN Meds:.diphenhydrAMINE, diphenhydrAMINE, naloxone, ondansetron (ZOFRAN) IV, ondansetron (ZOFRAN) IV, oxyCODONE, oxyCODONE-acetaminophen, potassium chloride, senna-docusate, sodium chloride, traMADol  General appearance: alert, cooperative and no distress Heart: regular rate and rhythm Lungs: dim left base Abdomen: benign Extremities: no  edema Wound: dressings intact  Lab Results: CBC: Recent Labs  07/30/13 1758 07/31/13 0425  WBC 8.3 8.1  HGB 11.6* 10.2*  HCT 32.9* 29.5*  PLT 354 294   BMET:  Recent Labs  07/30/13 1758 07/31/13 0425  NA 135 133*  K 3.8 4.2  CL 96 99  CO2 27 27  GLUCOSE 118* 159*  BUN 13 12  CREATININE 0.71 0.65  CALCIUM 9.3 8.3*    PT/INR:  Recent Labs  07/30/13 1758  LABPROT 13.3  INR 1.03   Radiology: Dg Chest 2 View  07/30/2013   *RADIOLOGY REPORT*  Clinical Data: Right-sided chest tube.  Short of breath.  Chest pain.  CHEST - 2 VIEW  Comparison: 07/29/2013.  Findings: Right thoracostomy tube remains present however this has been retracted and the sideport is now at the thoracic cage. Minimal soft tissue emphysema.  No pneumothorax.  On the frontal view, the appearance of the chest is little changed.  There is some positional shift in the right pleural effusion with increasing density inferiorly.  The left lung appears unchanged with basilar atelectasis. Monitoring leads are projected over the chest.  On the lateral view, the right pleural effusion appears moderate. There is no left pleural effusion identified. Cholecystectomy clips are present in the right upper quadrant.  Dense discoid atelectasis in the right midlung.  IMPRESSION:  1.  Slight retraction  of right thoracostomy tube with the side port at the rib cage. 2.  Shift in the right moderate pleural effusion.  Associated atelectasis appears similar. 3.  Persistent left basilar atelectasis.   Original Report Authenticated By: Andreas Newport, M.D.   Ct Chest Wo Contrast  07/30/2013   *RADIOLOGY REPORT*  Clinical Data: Right-sided hemothorax; post trauma  CT CHEST WITHOUT CONTRAST  Technique:  Multidetector CT imaging of the chest was performed following the standard protocol without IV contrast.  Comparison: Chest radiograph July 30, 2013 and chest CT July 28, 2013  Findings: There is now a chest tube on the right with the side port  of the tube just outside of the right pleural space.  There is minimal subcutaneous air in this area.  In comparison with the previous study, loculated fluid the right has increased slightly.  There is consolidation throughout the left lower lobe as well as some patchy infiltrate in the right middle lobe medially.  There is no appreciable pneumothorax.  On the left, there is patchy atelectasis in the inferior lingula and anterior and lateral segments of the left lower lobe.  There is no appreciable adenopathy.  Pericardium is not thickened.  There is a fracture of the right lateral tenth rib which is displaced.  No other fracture is appreciated on this study.  No blastic or lytic bone lesions are identified.  Thyroid appears normal.  IMPRESSION: Chest tube on the right is present with the side port slightly lateral to the right pleural surface.  Minimal subcutaneous air is seen on the right.  Loculated effusion, consistent with hemorrhagic fluid, is seen on the right.  There is right lower lobe consolidation with patchy consolidation in the right middle lobe.  There is patchy atelectasis inferiorly on the left.  There is a displaced fracture of the right tenth rib posteriorly. No pneumothorax.  No adenopathy.   Original Report Authenticated By: Bretta Bang, M.D.   Dg Chest Port 1 View  07/31/2013   *RADIOLOGY REPORT*  Clinical Data: Chest tube follow-up  PORTABLE CHEST - 1 VIEW  Comparison: 07/30/2013  Findings: Right-sided chest tube unchanged.  Negative for pneumothorax.  Right jugular catheter tip at the cavoatrial junction unchanged.  Right lower lobe volume loss and atelectasis unchanged.  Mild left lower lobe atelectasis also unchanged.  No significant effusion.  IMPRESSION: Bibasilar atelectasis is unchanged.  Negative for pneumothorax.   Original Report Authenticated By: Janeece Riggers, M.D.   Dg Chest Portable 1 View  07/30/2013   *RADIOLOGY REPORT*  Clinical Data: Status post right VATS with  evacuation of hemothorax.  PORTABLE CHEST - 1 VIEW  Comparison: Chest x-ray earlier today.  Findings: Right chest tube is visualized.  No pneumothorax is identified.  There is reduction and right pleural fluid volume postoperatively.  Atelectasis present in both lower lungs.  Heart size is stable.  Central line has been placed with the tip in the lower SVC.  IMPRESSION: Decrease in right pleural fluid volume postoperatively.  Right chest tube present with no pneumothorax identified.   Original Report Authenticated By: Irish Lack, M.D.   Chest tube without air leak, 671 cc recorded yesterday, 150 so far today    Assessment/Plan: S/P Procedure(s) (LRB): VIDEO ASSISTED THORACOSCOPY (VATS)/THOROCOTOMY (Right)  1 doing well overall 2 cont PCA 3 d/c foley and art line 4 keep chest tube 5 h/h fairly stable- drainage is sero-sang, monitor 6 push pulm toilet /rehab as able Jordan Baldwin,Jordan Baldwin 07/31/2013 9:59 AM   CXR improve  post VATS Hct 32 patient examined and medical record reviewed,agree with above note. VAN TRIGT III,Jordan Baldwin 07/31/2013

## 2013-08-01 ENCOUNTER — Inpatient Hospital Stay (HOSPITAL_COMMUNITY): Payer: Medicare Other

## 2013-08-01 LAB — COMPREHENSIVE METABOLIC PANEL
ALT: 50 U/L (ref 0–53)
AST: 42 U/L — ABNORMAL HIGH (ref 0–37)
Alkaline Phosphatase: 87 U/L (ref 39–117)
CO2: 29 mEq/L (ref 19–32)
Calcium: 8.3 mg/dL — ABNORMAL LOW (ref 8.4–10.5)
Chloride: 100 mEq/L (ref 96–112)
GFR calc non Af Amer: >90 mL/min (ref >90)
Potassium: 3.8 mEq/L (ref 3.5–5.1)
Sodium: 135 mEq/L (ref 135–145)

## 2013-08-01 LAB — BODY FLUID CULTURE

## 2013-08-01 LAB — CBC
MCH: 30.2 pg (ref 26.0–34.0)
Platelets: 297 10*3/uL (ref 150–400)
RBC: 3.25 MIL/uL — ABNORMAL LOW (ref 4.22–5.81)
WBC: 8.3 10*3/uL (ref 4.0–10.5)

## 2013-08-01 MED ORDER — FERROUS SULFATE 325 (65 FE) MG PO TABS
325.0000 mg | ORAL_TABLET | Freq: Two times a day (BID) | ORAL | Status: DC
  Administered 2013-08-01 – 2013-08-02 (×2): 325 mg via ORAL
  Filled 2013-08-01 (×4): qty 1

## 2013-08-01 NOTE — Progress Notes (Addendum)
TCTS DAILY ICU PROGRESS NOTE                   [address]            [name] [address]          [phone]   2 Days Post-Op Procedure(s) (LRB): VIDEO ASSISTED THORACOSCOPY (VATS)/THOROCOTOMY (Right)  Total Length of Stay:  LOS: 4 days   Subjective: Feels ok, not Sob, not much pain  Objective: Vital signs in last 24 hours: Temp:  [97.7 F (36.5 C)-100 F (37.8 C)] 100 F (37.8 C) (08/02 0700) Pulse Rate:  [56-70] 70 (08/02 0415) Cardiac Rhythm:  [-] Normal sinus rhythm (08/02 0415) Resp:  [12-20] 17 (08/02 0415) BP: (127-154)/(58-75) 149/67 mmHg (08/02 0415) SpO2:  [89 %-100 %] 92 % (08/02 0415)  Filed Weights   07/28/13 1811 07/30/13 2156  Weight: 246 lb 12.8 oz (111.948 kg) 246 lb 7.6 oz (111.8 kg)    Weight change:    Hemodynamic parameters for last 24 hours:    Intake/Output from previous day: 08/01 0701 - 08/02 0700 In: 1330.8 [I.V.:1330.8] Out: 545 [Urine:475; Chest Tube:70]  Intake/Output this shift:    Current Meds: Scheduled Meds: . bisacodyl  10 mg Oral Daily  . fentaNYL   Intravenous Q4H  . pantoprazole  40 mg Oral Daily   Continuous Infusions: . dextrose 5 % and 0.9 % NaCl with KCl 20 mEq/L 50 mL/hr at 07/31/13 1900   PRN Meds:.diphenhydrAMINE, diphenhydrAMINE, naloxone, ondansetron (ZOFRAN) IV, ondansetron (ZOFRAN) IV, oxyCODONE-acetaminophen, potassium chloride, senna-docusate, sodium chloride, traMADol  General appearance: alert, cooperative and no distress Heart: regular rate and rhythm Lungs: dim in right>left base Abdomen: soft, nontender, mild distension Wound: dressings intact  Lab Results: CBC: Recent Labs  07/31/13 0425 08/01/13 0530  WBC 8.1 8.3  HGB 10.2* 9.8*  HCT 29.5* 27.9*  PLT 294 297   BMET:  Recent Labs  07/31/13 0425 08/01/13 0530  NA 133* 135  K 4.2 3.8  CL 99 100  CO2 27 29  GLUCOSE 159* 119*  BUN 12 8  CREATININE 0.65 0.66  CALCIUM 8.3* 8.3*    PT/INR:  Recent Labs  07/30/13 1758  LABPROT 13.3  INR 1.03   Radiology: Dg Chest 2 View  08/01/2013   *RADIOLOGY REPORT*  Clinical Data: VATS.  CHEST - 2 VIEW  Comparison: 07/31/2013  Findings: Postoperative changes on the right.  Right basilar chest tube remains in place.  No pneumothorax.  Stable right central line.  Low lung volumes with bibasilar opacities, likely atelectasis, right greater than left.  This is improved slightly in the left base since prior study.  Heart is borderline in size. Small right pleural effusions suspected.  IMPRESSION: Postoperative changes.  No pneumothorax.  Improving left base atelectasis.  Continued right base atelectasis and small right effusion.   Original Report Authenticated By: Charlett Nose, M.D.   Dg Chest Port 1 View  07/31/2013   *RADIOLOGY REPORT*  Clinical Data: Chest tube follow-up  PORTABLE CHEST - 1 VIEW  Comparison: 07/30/2013  Findings: Right-sided chest tube unchanged.  Negative for pneumothorax.  Right jugular catheter tip at the cavoatrial junction unchanged.  Right lower lobe volume loss and atelectasis unchanged.  Mild left lower lobe atelectasis also unchanged.  No significant effusion.  IMPRESSION: Bibasilar atelectasis is unchanged.  Negative for pneumothorax.   Original Report Authenticated By: Janeece Riggers, M.D.   Dg Chest Portable 1 View  07/30/2013   *RADIOLOGY REPORT*  Clinical Data: Status post right VATS with evacuation of hemothorax.  PORTABLE CHEST - 1 VIEW  Comparison: Chest x-ray earlier today.  Findings: Right chest tube is visualized.  No pneumothorax is identified.  There is reduction and right pleural fluid volume postoperatively.  Atelectasis present in both lower lungs.  Heart size is stable.  Central line has been placed with the tip in the lower SVC.  IMPRESSION: Decrease in right pleural fluid volume postoperatively.  Right chest tube present with no pneumothorax identified.   Original Report Authenticated By: Irish Lack, M.D.      Assessment/Plan: S/P Procedure(s) (LRB): VIDEO ASSISTED THORACOSCOPY (VATS)/THOROCOTOMY (Right)  1 Chest tube with only 70 cc out yesterday, xray does show some fluid in right base. Poss d/c tube soon 2 H/H conts to decrease slowly, follow. Will add iron 3 d/c pca and central line   GOLD,WAYNE E 08/01/2013 9:41 AM   Chart reviewed, patient examined, agree with above. Chest tube output minimal serous. CXR looks ok. There is left lower lobe atelectasis but I doubt there is any significant effusion with a chest tube in place. Will remove it today and continue mobilization.

## 2013-08-01 NOTE — Progress Notes (Signed)
Wasted 33ml or of fentanyl in sink, Osvaldo Human RN to witness waste.

## 2013-08-01 NOTE — Progress Notes (Signed)
Chest tube removed per MD order. Pt tolerated procedure well. Will continue to monitor.  

## 2013-08-02 ENCOUNTER — Inpatient Hospital Stay (HOSPITAL_COMMUNITY): Payer: Medicare Other

## 2013-08-02 MED ORDER — FERROUS SULFATE 325 (65 FE) MG PO TABS: 325.0000 mg | ORAL_TABLET | Freq: Two times a day (BID) | ORAL | Status: DC

## 2013-08-02 MED ORDER — OXYCODONE-ACETAMINOPHEN 5-325 MG PO TABS: 1.0000 | ORAL_TABLET | Freq: Four times a day (QID) | ORAL | Status: DC | PRN

## 2013-08-02 NOTE — Progress Notes (Signed)
TCTS DAILY ICU PROGRESS NOTE                   [address]            [name] [address]          [phone]   3 Days Post-Op Procedure(s) (LRB): VIDEO ASSISTED THORACOSCOPY (VATS)/THOROCOTOMY (Right)  Total Length of Stay:  LOS: 5 days   Subjective: Feels well, walking the unit without O2  Objective: Vital signs in last 24 hours: Temp:  [98 F (36.7 C)-99.5 F (37.5 C)] 99.5 F (37.5 C) (08/03 0700) Pulse Rate:  [62-75] 63 (08/03 0320) Cardiac Rhythm:  [-] Normal sinus rhythm (08/03 0320) Resp:  [13-20] 13 (08/03 0320) BP: (132-160)/(66-77) 143/70 mmHg (08/03 0320) SpO2:  [92 %-96 %] 96 % (08/03 0320)  Filed Weights   07/28/13 1811 07/30/13 2156  Weight: 246 lb 12.8 oz (111.948 kg) 246 lb 7.6 oz (111.8 kg)    Weight change:    Hemodynamic parameters for last 24 hours:    Intake/Output from previous day: 08/02 0701 - 08/03 0700 In: 1090 [P.O.:840; I.V.:250] Out: 550 [Urine:550]  Intake/Output this shift:    Current Meds: Scheduled Meds: . bisacodyl  10 mg Oral Daily  . ferrous sulfate  325 mg Oral BID WC  . pantoprazole  40 mg Oral Daily   Continuous Infusions:  PRN Meds:.ondansetron (ZOFRAN) IV, oxyCODONE-acetaminophen, potassium chloride, senna-docusate, traMADol  General appearance: alert, cooperative and no distress Heart: regular rate and rhythm Lungs: mildly dim in bases Wound: incis healing well  Lab Results: CBC: Recent Labs  07/31/13 0425 08/01/13 0530  WBC 8.1 8.3  HGB 10.2* 9.8*  HCT 29.5* 27.9*  PLT 294 297   BMET:  Recent Labs  07/31/13 0425 08/01/13 0530  NA 133* 135  K 4.2 3.8  CL 99 100  CO2 27 29  GLUCOSE 159* 119*  BUN 12 8  CREATININE 0.65 0.66  CALCIUM 8.3* 8.3*    PT/INR:  Recent Labs  07/30/13 1758  LABPROT 13.3  INR 1.03   Radiology: Dg Chest 2 View  08/02/2013   *RADIOLOGY REPORT*  Clinical Data: Status post evacuation of right hemothorax.  CHEST - 2 VIEW  Comparison: Prior  radiograph from 08/01/2013.  Findings: Postoperative changes are again seen on the right.  There has been interval removal of a right-sided chest tube.  Right IJ central venous catheter is also been removed.  The lungs remain hypoinflated.  Right basilar airspace opacities persist, most consistent with atelectasis. Right pleural effusion is unchanged.  The left lung remains grossly clear. No pneumothorax.  The cardiac and mediastinal silhouettes are unchanged.  IMPRESSION:  Stable exam with persistent right basilar atelectasis and right pleural effusion.  No pneumothorax identified.   Original Report Authenticated By: Rise Mu, M.D.   Dg Chest 2 View  08/01/2013   *RADIOLOGY REPORT*  Clinical Data: VATS.  CHEST - 2 VIEW  Comparison: 07/31/2013  Findings: Postoperative changes on the right.  Right basilar chest tube remains in place.  No pneumothorax.  Stable right central line.  Low lung volumes with bibasilar opacities, likely atelectasis, right greater than left.  This is improved slightly in the left base since prior study.  Heart is borderline in size. Small right pleural effusions suspected.  IMPRESSION: Postoperative changes.  No pneumothorax.  Improving left base atelectasis.  Continued right base atelectasis and small right effusion.   Original Report Authenticated By: Charlett Nose, M.D.  Assessment/Plan: S/P Procedure(s) (LRB): VIDEO ASSISTED THORACOSCOPY (VATS)/THOROCOTOMY (Right) Plan for discharge: see discharge orders     Aristide Waggle E 08/02/2013 9:24 AM

## 2013-08-02 NOTE — Progress Notes (Signed)
Pt discharged home per MD order. All discharge instructions were reviewed and all questions answered. Surgical site clean, dry, and intact without signs of infections.

## 2013-08-05 ENCOUNTER — Other Ambulatory Visit: Payer: Self-pay | Admitting: *Deleted

## 2013-08-05 DIAGNOSIS — J942 Hemothorax: Secondary | ICD-10-CM

## 2013-08-07 ENCOUNTER — Encounter (INDEPENDENT_AMBULATORY_CARE_PROVIDER_SITE_OTHER): Payer: Self-pay | Admitting: *Deleted

## 2013-08-07 DIAGNOSIS — D381 Neoplasm of uncertain behavior of trachea, bronchus and lung: Secondary | ICD-10-CM

## 2013-08-13 ENCOUNTER — Other Ambulatory Visit: Payer: Self-pay | Admitting: *Deleted

## 2013-08-17 ENCOUNTER — Ambulatory Visit (INDEPENDENT_AMBULATORY_CARE_PROVIDER_SITE_OTHER): Payer: Medicare (Managed Care) | Admitting: Physician Assistant

## 2013-08-17 ENCOUNTER — Ambulatory Visit
Admission: RE | Admit: 2013-08-17 | Discharge: 2013-08-17 | Disposition: A | Discharge status: Home / Self Care | Payer: Medicare (Managed Care) | Source: Ambulatory Visit | Attending: Thoracic Surgery (Cardiothoracic Vascular Surgery) | Admitting: Thoracic Surgery (Cardiothoracic Vascular Surgery)

## 2013-08-17 ENCOUNTER — Other Ambulatory Visit: Payer: Self-pay | Admitting: *Deleted

## 2013-08-17 VITALS — BP 140/90 | HR 70 | Resp 20 | Ht 69.5 in | Wt 246.0 lb

## 2013-08-17 DIAGNOSIS — J942 Hemothorax: Secondary | ICD-10-CM

## 2013-08-17 DIAGNOSIS — J90 Pleural effusion, not elsewhere classified: Secondary | ICD-10-CM

## 2013-08-17 DIAGNOSIS — Z09 Encounter for follow-up examination after completed treatment for conditions other than malignant neoplasm: Secondary | ICD-10-CM

## 2013-08-17 NOTE — Progress Notes (Signed)
  HPI: Patient returns for routine postoperative follow-up having undergone Right Vats for drainage of Hemothorax on 07/30/2013. The patient's early postoperative recovery while in the hospital was unremarkable.  Since hospital discharge the patient reports that he is doing very well.  He denies shortness of breath and chest pain.  He does have a burning sensation on his right side that wraps around his chest.  I explained to the patient this is most likely related to nerve injury from the procedure and should improve over time.  He is ambulating without difficulty and continues to use his IS.   Current Outpatient Prescriptions  Medication Sig Dispense Refill  . ferrous sulfate 325 (65 FE) MG tablet Take 1 tablet (325 mg total) by mouth 2 (two) times daily with a meal.  60 tablet  3   No current facility-administered medications for this visit.    Physical Exam:  BP 140/90  Pulse 70  Resp 20  Ht 5' 9.5" (1.765 m)  Wt 246 lb (111.585 kg)  BMI 35.82 kg/m2  SpO2 96%  Gen: no apparent distress Heart: RRR Lungs: Diminished right base Skin: incisions healing well, no signs of infection  Diagnostic Tests:  CXR: some residual right pleural effusion and atelectasis present  A/P:  1. Patient is doing well post VATS.  He does have some residual right pleural fluid and atelectasis.  He continues to use his IS  2. We will bring him back to clinic in 4 weeks with a repeat CXR.  Patient is instructed to contact our office sooner should he develop any dyspnea or chest pain

## 2013-08-18 ENCOUNTER — Ambulatory Visit: Payer: Medicare Other | Admitting: Thoracic Surgery (Cardiothoracic Vascular Surgery)

## 2013-09-21 ENCOUNTER — Ambulatory Visit (INDEPENDENT_AMBULATORY_CARE_PROVIDER_SITE_OTHER): Payer: Medicare Other | Admitting: Thoracic Surgery (Cardiothoracic Vascular Surgery)

## 2013-09-21 ENCOUNTER — Encounter: Payer: Self-pay | Admitting: Thoracic Surgery (Cardiothoracic Vascular Surgery)

## 2013-09-21 ENCOUNTER — Ambulatory Visit
Admission: RE | Admit: 2013-09-21 | Discharge: 2013-09-21 | Disposition: A | Discharge status: Home / Self Care | Payer: Medicare Other | Source: Ambulatory Visit | Attending: Thoracic Surgery (Cardiothoracic Vascular Surgery) | Admitting: Thoracic Surgery (Cardiothoracic Vascular Surgery)

## 2013-09-21 VITALS — BP 170/93 | HR 60 | Resp 20 | Ht 69.5 in | Wt 246.0 lb

## 2013-09-21 DIAGNOSIS — Z09 Encounter for follow-up examination after completed treatment for conditions other than malignant neoplasm: Secondary | ICD-10-CM | POA: Insufficient documentation

## 2013-09-21 DIAGNOSIS — J90 Pleural effusion, not elsewhere classified: Secondary | ICD-10-CM

## 2013-09-21 DIAGNOSIS — J942 Hemothorax: Secondary | ICD-10-CM

## 2013-09-21 NOTE — Progress Notes (Signed)
      [address]       [place] [address]             [phone]     CARDIOTHORACIC SURGERY OFFICE NOTE  Referring Provider is Pecola Lawless, MD PCP is Marga Melnick, MD   HPI:  Patient returns for routine followup status post right by mouth assisted thoracoscopy for drainage of hemothorax on 07/30/2013.  The patient's hemothorax developed subsequent to a fall with fracture of the right 10th rib. He was last seen in our office on 08/17/2013. Since then he has done well. He reports that all of the pain and soreness in his chest has essentially resolved. His exercise tolerance and breathing capacity seemed to be gradually improving. He still has mild numbness radiating around the anterior aspect of the right chest in the nerve distribution of his minithoracotomy and chest tube placement. He otherwise feels quite well.   No current outpatient prescriptions on file.   No current facility-administered medications for this visit.      Physical Exam:   BP 170/93  Pulse 60  Resp 20  Ht 5' 9.5" (1.765 m)  Wt 246 lb (111.585 kg)  BMI 35.82 kg/m2  SpO2 98%  General:  Well-appearing  Chest:   Clear to auscultation with symmetrical breath sounds  CV:   Regular rate and rhythm  Incisions:  Completely healed  Abdomen:  Soft and nontender  Extremities:  Warm and well-perfused  Diagnostic Tests:  CLINICAL DATA: 67 year old male status post VATS. Fall, right chest  injury.  EXAM:  CHEST 2 VIEW  COMPARISON: 08/17/2013 and earlier.  FINDINGS:  Stable lung volumes with decreased lung volume on the right.  Continued right lower lung pleural based opacity, stable since  08/17/2013. Associated thickening or scarring at the right minor  fissure contiguous with the right hilum, also stable. No  pneumothorax or edema. No acute findings in the left lung. Stable  cardiac size and mediastinal contours. No acute osseous abnormality  identified.  IMPRESSION:  Stable  pleural based and perihilar opacity in the right lung. No  superimposed acute findings are identified.  Electronically Signed  By: Augusto Gamble M.D.  On: 09/21/2013 14:04    Impression:  The patient is doing well more than 6 weeks following redo assisted thoracoscopy for drainage of hemothorax developed subsequent to traumatic fracture of the right 10th rib. The patient is clinically doing very well. Late followup chest x-ray looks good.  Plan:  In the future the patient will call and return to see [place] as needed. He may gradually resume normal activity without any particular limitations.   Salvatore Decent. Cornelius Moras, MD 09/21/2013 1:45 PM

## 2013-09-21 NOTE — Patient Instructions (Signed)
Patient may resume normal physical activity without any particular limitations at this time, and return to our office only as needed should any further problems or questions arise.  

## 2013-12-07 ENCOUNTER — Other Ambulatory Visit: Payer: Self-pay | Admitting: Dermatology

## 2014-03-30 ENCOUNTER — Other Ambulatory Visit: Payer: Self-pay | Admitting: Dermatology

## 2015-01-13 ENCOUNTER — Encounter (HOSPITAL_COMMUNITY): Payer: Self-pay | Admitting: Thoracic Surgery (Cardiothoracic Vascular Surgery)

## 2018-06-03 ENCOUNTER — Other Ambulatory Visit: Payer: Self-pay | Admitting: Orthopaedic Surgery

## 2018-06-03 DIAGNOSIS — M25512 Pain in left shoulder: Secondary | ICD-10-CM

## 2018-06-05 ENCOUNTER — Ambulatory Visit
Admission: RE | Admit: 2018-06-05 | Discharge: 2018-06-05 | Disposition: A | Discharge status: Home / Self Care | Payer: Medicare Other | Source: Ambulatory Visit | Attending: Orthopaedic Surgery | Admitting: Orthopaedic Surgery

## 2018-06-05 DIAGNOSIS — M25512 Pain in left shoulder: Secondary | ICD-10-CM

## 2018-10-13 NOTE — Progress Notes (Signed)
Cardiology Office Note   Date:  10/16/2018   ID:  DADEN MAHANY, DOB 1946/06/17, MRN [id number]  PCP:  Hendricks Limes, MD  Cardiologist:   Jenkins Rouge, MD   No chief complaint on file.     History of Present Illness: [place] is a 72 y.o. male who presents for preoperative clearance Referred by Dr Ronnald Ramp Dr Griffin Basil with [facility] to do left shoulder replacement   TTE report from 09/13/15 EF 55-60% mild LVH grade one diastolic Mild LAE mild AR ? Moderate RV enlargement Seen by cardiology in [facility] noted he was very active without symptoms Previous smoker quit over 50 years ago Normal stress test in 2007 noted to have AV sclerosis or Flow murmur Needs a total left shoulder replacement  Chronic history of feeling like his heart is racing. Gets anxious with episodes but no chest pain Dyspnea or presyncope. 2014 also had right lung hemothorax with chest tube ? From trauma with fall and fractured 10 th rib. Felt to need cardiology clearance due to history of murmur, tachycardia and abnormal ECG BP has been labile patient wants to Rx with diet and exercise   In 2008 had dilated aortic root Rx Altace size 4.3 cm with mild AR HLD Rx with Crestor and ECG noted with ICRBBB  Discussed him not taking any BP meds or cholesterol meds Sounds like he had myalgias on statin in past LDL is 127 K 3.9 Cr .95 on September 15 2018  Past Medical History:  Diagnosis Date  . ANEMIA-NOS 11/29/2008   Qualifier: Diagnosis of  By: Linna Darner MD, [place] [facility] 10/18/2010   Qualifier: Diagnosis of  By: Ron Parker, MD, Leonidas Romberg Dorinda Hill   . Arthritis    "hands, feet, shoulders, back" (07/29/2013)  . ARTHROSCOPY, RIGHT KNEE, HX OF 11/29/2008   Qualifier: Diagnosis of  By: Emmaline Kluver CMA, [place]    . Atelectasis of right lung 07/28/2013  . Basal cell cancer    "neck, right arm" (07/29/2013)  . Fracture of rib of right side 07/28/2013  . Heart murmur   . Hemothorax on right  07/28/2013   Associated with fall 7 days prior to admission and right 10th rib fracture  . High cholesterol   . HYPERLIPIDEMIA 11/29/2008   Qualifier: Diagnosis of  By: Linna Darner MD, Gwyndolyn Saxon    . Hypertension   . IBS 11/29/2008   Qualifier: Diagnosis of  By: Emmaline Kluver CMA, [place]    . Kidney stones    "passed them all" (07/29/2013)  . NEOPLASM, SKIN 11/29/2008   Qualifier: Diagnosis of  By: Emmaline Kluver CMA, [place]    . OVERWEIGHT 10/18/2010   Qualifier: Diagnosis of  By: Ron Parker, MD, Caffie Damme   . PEPTIC ULCER DISEASE 11/29/2008   Qualifier: Diagnosis of  By: Emmaline Kluver CMA, [place]    . RENAL CALCULUS, HX OF 11/29/2008   Qualifier: Diagnosis of  By: Emmaline Kluver CMA, [place]    . Shortness of breath    "not before I broke my rib" (07/29/2013)    Past Surgical History:  Procedure Laterality Date  . CHEST TUBE INSERTION Right 07/28/2013  . CHOLECYSTECTOMY    . ELBOW SURGERY Right    "2 ORs:  lateral detachment of the muscle; clean out bone chips and calcium deposits" (07/29/2013)  . KNEE ARTHROSCOPY Right   . TONSILLECTOMY AND ADENOIDECTOMY    . VIDEO ASSISTED THORACOSCOPY (VATS)/THOROCOTOMY Right 07/30/2013   Procedure: VIDEO ASSISTED THORACOSCOPY (VATS)/THOROCOTOMY;  Surgeon: Rexene Alberts, MD;  Location: [place];  Service: Thoracic;  Laterality: Right;  evacuation of hemothorax     Current Outpatient Medications  Medication Sig Dispense Refill  . losartan (COZAAR) 25 MG tablet Take 1 tablet (25 mg total) by mouth daily. 90 tablet 3   No current facility-administered medications for this visit.     Allergies:   Patient has no known allergies.    Social History:  The patient  reports that he has quit smoking. His smoking use included cigarettes. He has a 0.25 pack-year smoking history. He has never used smokeless tobacco. He reports that he drinks about 3.0 standard drinks of alcohol per week. He reports that he does not use drugs.   Family History:  The patient's family history is not on file.      ROS:  Please see the history of present illness.   Otherwise, review of systems are positive for none.   All other systems are reviewed and negative.    PHYSICAL EXAM: VS:  BP (!) 150/94   Pulse (!) 55   Ht 5' 9.5" (1.765 m)   Wt 251 lb 6.4 oz (114 kg)   SpO2 97%   BMI 36.59 kg/m  , BMI Body mass index is 36.59 kg/m. Affect appropriate Overweight white male  HEENT: normal Neck supple with no adenopathy JVP normal no bruits no thyromegaly Lungs clear with no wheezing and good diaphragmatic motion Heart:  S1/S2 SEM  murmur, no rub, gallop or click PMI normal Abdomen: benighn, BS positve, no tenderness, no AAA no bruit.  No HSM or HJR Distal pulses intact with no bruits No edema Neuro non-focal Skin warm and dry Restricted left shoulder motion     EKG:  2014 SR rate 78 ICRBBB   10/16/18 SR rate 55 ICRBBB LVH    Recent Labs: No results found for requested labs within last 8760 hours.    Lipid Panel    Component Value Date/Time   CHOL 241 (H) 10/17/2010 0848   TRIG 137.0 10/17/2010 0848   HDL 52.10 10/17/2010 0848   CHOLHDL 5 10/17/2010 0848   VLDL 27.4 10/17/2010 0848   LDLCALC 124 (H) 04/20/2008 0839   LDLDIRECT 167.6 10/17/2010 0848      Wt Readings from Last 3 Encounters:  10/16/18 251 lb 6.4 oz (114 kg)  09/21/13 246 lb (111.6 kg)  08/17/13 246 lb (111.6 kg)      Other studies Reviewed: Additional studies/ records that were reviewed today include: Notes from Dr Herma Ard 2014 notes from [place] primary echo reports Notes Dr Roxy Manns for chest tube and hemothorax.    ASSESSMENT AND PLAN:  1.  Preoperative Clearance:  Will order cardiac CTA to clear as he needs CT for aortic aneursym f/u  2. HTN:: discussed possibility that surgery would be cancelled if BP too high Start cozaar 25 mg daily  3. 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 HLD:  He is still hesitant to start statin will see what calcium score is and how he responds to BP meds But will likely need re challenge with statin  5. Hemothorax: traumatic distant with right 10th rib fracture improved  6. Abnormal ECG:  Chronic ICRBBB observe yearly ECG    Current medicines are reviewed at length with the patient today.  The patient does  not have concerns regarding medicines.  The following changes have been made:  Cozaar 25 mg daily   Labs/ tests ordered today include: cardiac CTA TTE   Orders Placed This Encounter  Procedures  . CT CORONARY MORPH W/CTA COR W/SCORE W/CA W/CM &/OR WO/CM  . CT CORONARY FRACTIONAL FLOW RESERVE DATA PREP  . CT CORONARY FRACTIONAL FLOW RESERVE FLUID ANALYSIS  . ECHOCARDIOGRAM COMPLETE     Disposition:   FU with cardiology in a year      Signed, Jenkins Rouge, MD  10/16/2018 4:23 PM    [address], [place],   [address] Phone: [phone]; Fax: [phone]

## 2018-10-16 ENCOUNTER — Encounter: Payer: Self-pay | Admitting: Cardiovascular Disease

## 2018-10-16 ENCOUNTER — Ambulatory Visit (INDEPENDENT_AMBULATORY_CARE_PROVIDER_SITE_OTHER): Payer: Medicare Other | Admitting: Cardiovascular Disease

## 2018-10-16 ENCOUNTER — Encounter (INDEPENDENT_AMBULATORY_CARE_PROVIDER_SITE_OTHER): Payer: Self-pay

## 2018-10-16 VITALS — BP 150/94 | HR 55 | Ht 69.5 in | Wt 251.4 lb

## 2018-10-16 DIAGNOSIS — R011 Cardiac murmur, unspecified: Secondary | ICD-10-CM

## 2018-10-16 DIAGNOSIS — I77810 Thoracic aortic ectasia: Secondary | ICD-10-CM

## 2018-10-16 DIAGNOSIS — I10 Essential (primary) hypertension: Secondary | ICD-10-CM | POA: Diagnosis not present

## 2018-10-16 DIAGNOSIS — I350 Nonrheumatic aortic (valve) stenosis: Secondary | ICD-10-CM

## 2018-10-16 DIAGNOSIS — E785 Hyperlipidemia, unspecified: Secondary | ICD-10-CM | POA: Diagnosis not present

## 2018-10-16 DIAGNOSIS — Z01818 Encounter for other preprocedural examination: Secondary | ICD-10-CM

## 2018-10-16 MED ORDER — LOSARTAN POTASSIUM 25 MG PO TABS: 25.0000 mg | ORAL_TABLET | Freq: Every day | ORAL | 3 refills | Status: DC

## 2018-10-16 NOTE — Patient Instructions (Addendum)
Medication Instructions:  Your physician has recommended you make the following change in your medication:  1-START Losartan (Cozaar) 25 mg by mouth daily  If you need a refill on your cardiac medications before your next appointment, please call your pharmacy.   Lab work: If you have labs (blood work) drawn today and your tests are completely normal, you will receive your results only by: Marland Kitchen MyChart Message (if you have MyChart) OR . A paper copy in the mail If you have any lab test that is abnormal or we need to change your treatment, we will call you to review the results.  Testing/Procedures: Your physician has requested that you have an echocardiogram. Echocardiography is a painless test that uses sound waves to create images of your heart. It provides your doctor with information about the size and shape of your heart and how well your heart's chambers and valves are working. This procedure takes approximately one hour. There are no restrictions for this procedure.  Your physician has requested that you have cardiac CT. Cardiac computed tomography (CT) is a painless test that uses an x-ray machine to take clear, detailed pictures of your heart. For further information please visit [facility]. Please follow instruction sheet as given.   Follow-Up: At [facility], you and your health needs are our priority.  As part of our continuing mission to provide you with exceptional heart care, we have created designated Provider Care Teams.  These Care Teams include your primary Cardiologist (physician) and Advanced Practice Providers (APPs -  Physician Assistants and Nurse Practitioners) who all work together to provide you with the care you need, when you need it. You will need a follow up appointment in 3 months.  Please call our office 2 months in advance to schedule this appointment.  You may see Jenkins Rouge, MD or one of the following Advanced Practice Providers on your designated  Care Team:   Truitt Merle, NP Cecilie Kicks, NP . Kathyrn Drown, NP

## 2018-10-20 NOTE — Addendum Note (Signed)
Addended by: Rose Phi on: 10/20/2018 04:52 PM   Modules accepted: Orders

## 2018-10-27 ENCOUNTER — Other Ambulatory Visit: Payer: Self-pay

## 2018-10-27 ENCOUNTER — Ambulatory Visit (HOSPITAL_COMMUNITY): Payer: No Typology Code available for payment source | Attending: Cardiology

## 2018-10-27 DIAGNOSIS — I350 Nonrheumatic aortic (valve) stenosis: Secondary | ICD-10-CM | POA: Diagnosis not present

## 2018-10-27 DIAGNOSIS — R011 Cardiac murmur, unspecified: Secondary | ICD-10-CM | POA: Insufficient documentation

## 2018-10-27 DIAGNOSIS — Z01818 Encounter for other preprocedural examination: Secondary | ICD-10-CM | POA: Insufficient documentation

## 2018-10-28 ENCOUNTER — Telehealth: Payer: Self-pay

## 2018-10-28 DIAGNOSIS — I350 Nonrheumatic aortic (valve) stenosis: Secondary | ICD-10-CM

## 2018-10-28 NOTE — Telephone Encounter (Signed)
Patient aware of echo results. Per Dr. Johnsie Cancel, Mild AS EF normal f/u echo in a year. Patient verbalized understanding. Order placed for echo next year.

## 2018-10-28 NOTE — Telephone Encounter (Signed)
-----   Message from Josue Hector, MD sent at 10/28/2018  7:16 AM EDT ----- Mild AS EF normal f/u echo in a year

## 2018-11-10 ENCOUNTER — Other Ambulatory Visit: Payer: Self-pay

## 2018-11-10 DIAGNOSIS — I77810 Thoracic aortic ectasia: Secondary | ICD-10-CM

## 2018-11-10 DIAGNOSIS — Z01812 Encounter for preprocedural laboratory examination: Secondary | ICD-10-CM

## 2018-11-10 NOTE — Progress Notes (Signed)
Patient needs BMET prior to CT test. Patient had BMET on 09/15/18, but BMET has to be within 6 weeks. CT scheduled for 11/19/18.

## 2018-11-12 ENCOUNTER — Other Ambulatory Visit: Payer: No Typology Code available for payment source | Admitting: *Deleted

## 2018-11-12 DIAGNOSIS — I77810 Thoracic aortic ectasia: Secondary | ICD-10-CM

## 2018-11-12 DIAGNOSIS — Z01812 Encounter for preprocedural laboratory examination: Secondary | ICD-10-CM

## 2018-11-12 LAB — BASIC METABOLIC PANEL
BUN / CREAT RATIO: 15 (ref 10–24)
BUN: 13 mg/dL (ref 8–27)
CALCIUM: 8.9 mg/dL (ref 8.6–10.2)
CHLORIDE: 104 mmol/L (ref 96–106)
CO2: 24 mmol/L (ref 20–29)
Creatinine, Ser: 0.85 mg/dL (ref 0.76–1.27)
GFR, EST AFRICAN AMERICAN: 101 mL/min/{1.73_m2} (ref >59)
GFR, EST NON AFRICAN AMERICAN: 87 mL/min/{1.73_m2} (ref >59)
Glucose: 82 mg/dL (ref 65–99)
POTASSIUM: 4.4 mmol/L (ref 3.5–5.2)
Sodium: 143 mmol/L (ref 134–144)

## 2018-11-13 ENCOUNTER — Telehealth: Payer: Self-pay | Admitting: Cardiovascular Disease

## 2018-11-13 NOTE — Telephone Encounter (Signed)
Follow up ° °Pt returning call for nurse °

## 2018-11-14 NOTE — Telephone Encounter (Signed)
Patient aware of lab results. Per Dr. Johnsie Cancel BMET normal. Patient verbalized understanding.

## 2018-11-19 ENCOUNTER — Ambulatory Visit (HOSPITAL_COMMUNITY)
Admission: RE | Admit: 2018-11-19 | Discharge: 2018-11-19 | Disposition: A | Discharge status: Home / Self Care | Payer: No Typology Code available for payment source | Source: Ambulatory Visit | Attending: Cardiovascular Disease | Admitting: Cardiovascular Disease

## 2018-11-19 ENCOUNTER — Ambulatory Visit (HOSPITAL_COMMUNITY): Payer: No Typology Code available for payment source

## 2018-11-19 DIAGNOSIS — I700 Atherosclerosis of aorta: Secondary | ICD-10-CM | POA: Insufficient documentation

## 2018-11-19 DIAGNOSIS — I77810 Thoracic aortic ectasia: Secondary | ICD-10-CM | POA: Diagnosis not present

## 2018-11-19 DIAGNOSIS — Z01818 Encounter for other preprocedural examination: Secondary | ICD-10-CM | POA: Diagnosis not present

## 2018-11-19 MED ORDER — NITROGLYCERIN 0.4 MG SL SUBL
SUBLINGUAL_TABLET | SUBLINGUAL | Status: AC
  Administered 2018-11-19: 0.8 mg via SUBLINGUAL
  Filled 2018-11-19: qty 2

## 2018-11-19 MED ORDER — IOPAMIDOL (ISOVUE-370) INJECTION 76%
INTRAVENOUS | Status: AC
  Administered 2018-11-19: 80 mL
  Filled 2018-11-19: qty 100

## 2018-11-19 MED ORDER — NITROGLYCERIN 0.4 MG SL SUBL
0.8000 mg | SUBLINGUAL_TABLET | SUBLINGUAL | Status: DC | PRN
  Filled 2018-11-19: qty 25

## 2018-12-08 NOTE — Pre-Procedure Instructions (Signed)
Jordan Baldwin  12/08/2018      [facility] [place], [place] [address] Phone: [phone] Fax: [phone]    Your procedure is scheduled on Wednesday December 18th.  Report to [facility] Admitting at [place].M.  Call this number if you have problems the morning of surgery:  [phone]   Remember:  Do not eat or drink after midnight.    Take these medicines the morning of surgery with A SIP OF WATER  None  7 days prior to surgery STOP taking any meloxicam (MOBIC), Aspirin(unless otherwise instructed by your surgeon), Aleve, Naproxen, Ibuprofen, Motrin, Advil, Goody's, BC's, all herbal medications, fish oil, and all vitamins     Do not wear jewelry.  Do not wear lotions, powders, or colognes, or deodorant.  Men may shave face and neck.  Do not bring valuables to the hospital.  [facility] is not responsible for any belongings or valuables.  Contacts, dentures or bridgework may not be worn into surgery.  Leave your suitcase in the car.  After surgery it may be brought to your room.  For patients admitted to the hospital, discharge time will be determined by your treatment team.  Patients discharged the day of surgery will not be allowed to drive home.    [facility]- Preparing For Surgery  Before surgery, you can play an important role. Because skin is not sterile, your skin needs to be as free of germs as possible. You can reduce the number of germs on your skin by washing with CHG (chlorahexidine gluconate) Soap before surgery.  CHG is an antiseptic cleaner which kills germs and bonds with the skin to continue killing germs even after washing.    Oral Hygiene is also important to reduce your risk of infection.  Remember - BRUSH YOUR TEETH THE MORNING OF SURGERY WITH YOUR REGULAR TOOTHPASTE  Please do not use if you have an allergy to CHG or antibacterial soaps. If your skin becomes  reddened/irritated stop using the CHG.  Do not shave (including legs and underarms) for at least 48 hours prior to first CHG shower. It is OK to shave your face.  Please follow these instructions carefully.   1. Shower the NIGHT BEFORE SURGERY and the MORNING OF SURGERY with CHG.   2. If you chose to wash your hair, wash your hair first as usual with your normal shampoo.  3. After you shampoo, rinse your hair and body thoroughly to remove the shampoo.  4. Use CHG as you would any other liquid soap. You can apply CHG directly to the skin and wash gently with a scrungie or a clean washcloth.   5. Apply the CHG Soap to your body ONLY FROM THE NECK DOWN.  Do not use on open wounds or open sores. Avoid contact with your eyes, ears, mouth and genitals (private parts). Wash Face and genitals (private parts)  with your normal soap.  6. Wash thoroughly, paying special attention to the area where your surgery will be performed.  7. Thoroughly rinse your body with warm water from the neck down.  8. DO NOT shower/wash with your normal soap after using and rinsing off the CHG Soap.  9. Pat yourself dry with a CLEAN TOWEL.  10. Wear CLEAN PAJAMAS to bed the night before surgery, wear comfortable clothes the morning of surgery  11. Place CLEAN SHEETS on your bed the night of your first  shower and DO NOT SLEEP WITH PETS.    Day of Surgery:  Do not apply any deodorants/lotions.  Please wear clean clothes to the hospital/surgery center.   Remember to brush your teeth WITH YOUR REGULAR TOOTHPASTE.    Please read over the following fact sheets that you were given.

## 2018-12-09 ENCOUNTER — Encounter (HOSPITAL_COMMUNITY)
Admission: RE | Admit: 2018-12-09 | Discharge: 2018-12-09 | Disposition: A | Discharge status: Home / Self Care | Payer: Medicare Other | Source: Ambulatory Visit | Attending: Orthopaedic Surgery | Admitting: Orthopaedic Surgery

## 2018-12-09 ENCOUNTER — Encounter (HOSPITAL_COMMUNITY): Payer: Self-pay

## 2018-12-09 ENCOUNTER — Other Ambulatory Visit: Payer: Self-pay

## 2018-12-09 DIAGNOSIS — Z01812 Encounter for preprocedural laboratory examination: Secondary | ICD-10-CM | POA: Diagnosis present

## 2018-12-09 HISTORY — DX: Personal history of urinary calculi: Z87.442

## 2018-12-09 LAB — CBC
HCT: 43.6 % (ref 39.0–52.0)
Hemoglobin: 14.4 g/dL (ref 13.0–17.0)
MCH: 29 pg (ref 26.0–34.0)
MCHC: 33 g/dL (ref 30.0–36.0)
MCV: 87.9 fL (ref 80.0–100.0)
Platelets: 249 10*3/uL (ref 150–400)
RBC: 4.96 MIL/uL (ref 4.22–5.81)
RDW: 11.9 % (ref 11.5–15.5)
WBC: 5.3 10*3/uL (ref 4.0–10.5)
nRBC: 0 % (ref 0.0–0.2)

## 2018-12-09 LAB — BASIC METABOLIC PANEL
Anion gap: 10 (ref 5–15)
BUN: 12 mg/dL (ref 8–23)
CHLORIDE: 106 mmol/L (ref 98–111)
CO2: 25 mmol/L (ref 22–32)
Calcium: 9.1 mg/dL (ref 8.9–10.3)
Creatinine, Ser: 0.87 mg/dL (ref 0.61–1.24)
GFR calc Af Amer: >60 mL/min (ref >60)
GFR calc non Af Amer: >60 mL/min (ref >60)
Glucose, Bld: 96 mg/dL (ref 70–99)
Potassium: 4.3 mmol/L (ref 3.5–5.1)
Sodium: 141 mmol/L (ref 135–145)

## 2018-12-09 LAB — SURGICAL PCR SCREEN
MRSA, PCR: NEGATIVE
Staphylococcus aureus: NEGATIVE

## 2018-12-09 NOTE — Progress Notes (Signed)
PCP - Landry Mellow MD @ [place] Cardiologist - Dr. Johnsie Cancel   Chest x-ray - 09/15/18 EKG - 10/16/18 ECHO - 09/2018  Blood Thinner Instructions: N/A Aspirin Instructions: N/A  Anesthesia review: N/A  Patient denies shortness of breath, fever, cough and chest pain at PAT appointment   Patient verbalized understanding of instructions that were given to them at the PAT appointment. Patient was also instructed that they will need to review over the PAT instructions again at home before surgery.

## 2018-12-17 ENCOUNTER — Inpatient Hospital Stay (HOSPITAL_COMMUNITY): Payer: Medicare Other | Admitting: Physician Assistant

## 2018-12-17 ENCOUNTER — Other Ambulatory Visit: Payer: Self-pay

## 2018-12-17 ENCOUNTER — Inpatient Hospital Stay (HOSPITAL_COMMUNITY)
Admission: RE | Admit: 2018-12-17 | Discharge: 2018-12-18 | DRG: 483 | Disposition: A | Discharge status: Home / Self Care | Payer: Medicare Other | Attending: Orthopaedic Surgery | Admitting: Orthopaedic Surgery

## 2018-12-17 ENCOUNTER — Inpatient Hospital Stay (HOSPITAL_COMMUNITY): Payer: Medicare Other

## 2018-12-17 ENCOUNTER — Encounter (HOSPITAL_COMMUNITY)
Admission: RE | Disposition: A | Discharge status: Home / Self Care | Payer: Self-pay | Source: Home / Self Care | Attending: Orthopaedic Surgery

## 2018-12-17 ENCOUNTER — Inpatient Hospital Stay (HOSPITAL_COMMUNITY): Payer: Medicare Other | Admitting: Anesthesiology

## 2018-12-17 ENCOUNTER — Encounter (HOSPITAL_COMMUNITY): Payer: Self-pay

## 2018-12-17 DIAGNOSIS — M25712 Osteophyte, left shoulder: Secondary | ICD-10-CM | POA: Diagnosis present

## 2018-12-17 DIAGNOSIS — Z9049 Acquired absence of other specified parts of digestive tract: Secondary | ICD-10-CM | POA: Diagnosis not present

## 2018-12-17 DIAGNOSIS — I351 Nonrheumatic aortic (valve) insufficiency: Secondary | ICD-10-CM | POA: Diagnosis present

## 2018-12-17 DIAGNOSIS — Z8711 Personal history of peptic ulcer disease: Secondary | ICD-10-CM | POA: Diagnosis not present

## 2018-12-17 DIAGNOSIS — Z6836 Body mass index (BMI) 36.0-36.9, adult: Secondary | ICD-10-CM | POA: Diagnosis not present

## 2018-12-17 DIAGNOSIS — Z87891 Personal history of nicotine dependence: Secondary | ICD-10-CM

## 2018-12-17 DIAGNOSIS — Z85828 Personal history of other malignant neoplasm of skin: Secondary | ICD-10-CM | POA: Diagnosis not present

## 2018-12-17 DIAGNOSIS — M7522 Bicipital tendinitis, left shoulder: Secondary | ICD-10-CM | POA: Diagnosis present

## 2018-12-17 DIAGNOSIS — E785 Hyperlipidemia, unspecified: Secondary | ICD-10-CM | POA: Diagnosis present

## 2018-12-17 DIAGNOSIS — E7800 Pure hypercholesterolemia, unspecified: Secondary | ICD-10-CM | POA: Diagnosis present

## 2018-12-17 DIAGNOSIS — M19012 Primary osteoarthritis, left shoulder: Secondary | ICD-10-CM | POA: Diagnosis present

## 2018-12-17 DIAGNOSIS — Z791 Long term (current) use of non-steroidal anti-inflammatories (NSAID): Secondary | ICD-10-CM

## 2018-12-17 DIAGNOSIS — Z79899 Other long term (current) drug therapy: Secondary | ICD-10-CM

## 2018-12-17 DIAGNOSIS — Z09 Encounter for follow-up examination after completed treatment for conditions other than malignant neoplasm: Secondary | ICD-10-CM

## 2018-12-17 DIAGNOSIS — Z87442 Personal history of urinary calculi: Secondary | ICD-10-CM

## 2018-12-17 DIAGNOSIS — I10 Essential (primary) hypertension: Secondary | ICD-10-CM | POA: Diagnosis present

## 2018-12-17 HISTORY — PX: TOTAL SHOULDER ARTHROPLASTY: SHX126

## 2018-12-17 SURGERY — ARTHROPLASTY, SHOULDER, TOTAL
Anesthesia: Regional | Laterality: Left

## 2018-12-17 MED ORDER — SODIUM CHLORIDE 0.9 % IV SOLN
INTRAVENOUS | Status: DC | PRN
  Administered 2018-12-17: 20 ug/min via INTRAVENOUS

## 2018-12-17 MED ORDER — LACTATED RINGERS IV SOLN
INTRAVENOUS | Status: DC | PRN
  Administered 2018-12-17: 08:00:00 via INTRAVENOUS

## 2018-12-17 MED ORDER — METOCLOPRAMIDE HCL 5 MG/ML IJ SOLN: 5.0000 mg | Freq: Three times a day (TID) | INTRAMUSCULAR | Status: DC | PRN

## 2018-12-17 MED ORDER — ONDANSETRON HCL 4 MG/2ML IJ SOLN: 4.0000 mg | Freq: Four times a day (QID) | INTRAMUSCULAR | Status: DC | PRN

## 2018-12-17 MED ORDER — TRANEXAMIC ACID-NACL 1000-0.7 MG/100ML-% IV SOLN
1000.0000 mg | INTRAVENOUS | Status: AC
  Administered 2018-12-17: 1000 mg via INTRAVENOUS
  Filled 2018-12-17: qty 100

## 2018-12-17 MED ORDER — ZOLPIDEM TARTRATE 5 MG PO TABS: 5.0000 mg | ORAL_TABLET | Freq: Every evening | ORAL | Status: DC | PRN

## 2018-12-17 MED ORDER — FENTANYL CITRATE (PF) 100 MCG/2ML IJ SOLN: 25.0000 ug | INTRAMUSCULAR | Status: DC | PRN

## 2018-12-17 MED ORDER — ROCURONIUM BROMIDE 50 MG/5ML IV SOSY
PREFILLED_SYRINGE | INTRAVENOUS | Status: DC | PRN
  Administered 2018-12-17: 50 mg via INTRAVENOUS
  Administered 2018-12-17: 30 mg via INTRAVENOUS

## 2018-12-17 MED ORDER — ONDANSETRON HCL 4 MG/2ML IJ SOLN
INTRAMUSCULAR | Status: AC
  Filled 2018-12-17: qty 2

## 2018-12-17 MED ORDER — DEXAMETHASONE SODIUM PHOSPHATE 10 MG/ML IJ SOLN
INTRAMUSCULAR | Status: AC
  Filled 2018-12-17: qty 1

## 2018-12-17 MED ORDER — DIPHENHYDRAMINE HCL 12.5 MG/5ML PO ELIX: 12.5000 mg | ORAL_SOLUTION | ORAL | Status: DC | PRN

## 2018-12-17 MED ORDER — PROPOFOL 10 MG/ML IV BOLUS
INTRAVENOUS | Status: AC
  Filled 2018-12-17: qty 20

## 2018-12-17 MED ORDER — FENTANYL CITRATE (PF) 250 MCG/5ML IJ SOLN
INTRAMUSCULAR | Status: AC
  Filled 2018-12-17: qty 5

## 2018-12-17 MED ORDER — EPHEDRINE SULFATE-NACL 50-0.9 MG/10ML-% IV SOSY
PREFILLED_SYRINGE | INTRAVENOUS | Status: DC | PRN
  Administered 2018-12-17: 10 mg via INTRAVENOUS

## 2018-12-17 MED ORDER — OXYCODONE HCL 5 MG PO TABS: 5.0000 mg | ORAL_TABLET | Freq: Once | ORAL | Status: DC | PRN

## 2018-12-17 MED ORDER — OXYCODONE HCL 5 MG PO TABS: 5.0000 mg | ORAL_TABLET | ORAL | Status: DC | PRN

## 2018-12-17 MED ORDER — ONDANSETRON HCL 4 MG PO TABS: 4.0000 mg | ORAL_TABLET | Freq: Four times a day (QID) | ORAL | Status: DC | PRN

## 2018-12-17 MED ORDER — METOCLOPRAMIDE HCL 5 MG PO TABS: 5.0000 mg | ORAL_TABLET | Freq: Three times a day (TID) | ORAL | Status: DC | PRN

## 2018-12-17 MED ORDER — ONDANSETRON HCL 4 MG/2ML IJ SOLN: INTRAMUSCULAR | Status: DC | PRN

## 2018-12-17 MED ORDER — FENTANYL CITRATE (PF) 100 MCG/2ML IJ SOLN
INTRAMUSCULAR | Status: DC | PRN
  Administered 2018-12-17: 50 ug via INTRAVENOUS
  Administered 2018-12-17: 100 ug via INTRAVENOUS

## 2018-12-17 MED ORDER — CEFAZOLIN SODIUM-DEXTROSE 1-4 GM/50ML-% IV SOLN
1.0000 g | Freq: Four times a day (QID) | INTRAVENOUS | Status: AC
  Administered 2018-12-17 – 2018-12-18 (×3): 1 g via INTRAVENOUS
  Filled 2018-12-17 (×3): qty 50

## 2018-12-17 MED ORDER — ACETAMINOPHEN 500 MG PO TABS
1000.0000 mg | ORAL_TABLET | Freq: Three times a day (TID) | ORAL | Status: DC
  Administered 2018-12-17 – 2018-12-18 (×3): 1000 mg via ORAL
  Filled 2018-12-17 (×4): qty 2

## 2018-12-17 MED ORDER — CEFAZOLIN SODIUM-DEXTROSE 2-4 GM/100ML-% IV SOLN
2.0000 g | INTRAVENOUS | Status: AC
  Administered 2018-12-17: 2 g via INTRAVENOUS
  Filled 2018-12-17: qty 100

## 2018-12-17 MED ORDER — HYDROMORPHONE HCL 1 MG/ML IJ SOLN: 0.5000 mg | INTRAMUSCULAR | Status: DC | PRN

## 2018-12-17 MED ORDER — VANCOMYCIN HCL 1000 MG IV SOLR
INTRAVENOUS | Status: AC
  Filled 2018-12-17: qty 1000

## 2018-12-17 MED ORDER — PROPOFOL 10 MG/ML IV BOLUS
INTRAVENOUS | Status: DC | PRN
  Administered 2018-12-17: 140 mg via INTRAVENOUS

## 2018-12-17 MED ORDER — CHLORHEXIDINE GLUCONATE 4 % EX LIQD: 60.0000 mL | Freq: Once | CUTANEOUS | Status: DC

## 2018-12-17 MED ORDER — LIDOCAINE 2% (20 MG/ML) 5 ML SYRINGE
INTRAMUSCULAR | Status: DC | PRN
  Administered 2018-12-17: 60 mg via INTRAVENOUS

## 2018-12-17 MED ORDER — DOCUSATE SODIUM 100 MG PO CAPS
100.0000 mg | ORAL_CAPSULE | Freq: Two times a day (BID) | ORAL | Status: DC
  Administered 2018-12-17: 100 mg via ORAL
  Filled 2018-12-17: qty 1

## 2018-12-17 MED ORDER — DEXAMETHASONE SODIUM PHOSPHATE 10 MG/ML IJ SOLN
INTRAMUSCULAR | Status: DC | PRN
  Administered 2018-12-17: 5 mg via INTRAVENOUS
  Administered 2018-12-17: 10 mg via INTRAVENOUS

## 2018-12-17 MED ORDER — VANCOMYCIN HCL 1000 MG IV SOLR
INTRAVENOUS | Status: DC | PRN
  Administered 2018-12-17: 1000 mg via TOPICAL

## 2018-12-17 MED ORDER — 0.9 % SODIUM CHLORIDE (POUR BTL) OPTIME
TOPICAL | Status: DC | PRN
  Administered 2018-12-17: 1000 mL

## 2018-12-17 MED ORDER — OXYCODONE HCL 5 MG/5ML PO SOLN: 5.0000 mg | Freq: Once | ORAL | Status: DC | PRN

## 2018-12-17 MED ORDER — ONDANSETRON HCL 4 MG/2ML IJ SOLN
INTRAMUSCULAR | Status: DC | PRN
  Administered 2018-12-17: 4 mg via INTRAVENOUS

## 2018-12-17 MED ORDER — SODIUM CHLORIDE 0.9 % IR SOLN
Status: DC | PRN
  Administered 2018-12-17: 3000 mL

## 2018-12-17 MED ORDER — ACETAMINOPHEN 10 MG/ML IV SOLN: 1000.0000 mg | Freq: Once | INTRAVENOUS | Status: DC | PRN

## 2018-12-17 MED ORDER — ACETAMINOPHEN 500 MG PO TABS: 1000.0000 mg | ORAL_TABLET | Freq: Once | ORAL | Status: DC | PRN

## 2018-12-17 MED ORDER — SUGAMMADEX SODIUM 200 MG/2ML IV SOLN
INTRAVENOUS | Status: DC | PRN
  Administered 2018-12-17: 200 mg via INTRAVENOUS

## 2018-12-17 MED ORDER — ACETAMINOPHEN 160 MG/5ML PO SOLN: 1000.0000 mg | Freq: Once | ORAL | Status: DC | PRN

## 2018-12-17 MED ORDER — ROCURONIUM BROMIDE 50 MG/5ML IV SOSY
PREFILLED_SYRINGE | INTRAVENOUS | Status: AC
  Filled 2018-12-17: qty 5

## 2018-12-17 MED ORDER — LIDOCAINE 2% (20 MG/ML) 5 ML SYRINGE
INTRAMUSCULAR | Status: AC
  Filled 2018-12-17: qty 5

## 2018-12-17 SURGICAL SUPPLY — 72 items
AID PSTN UNV HD RSTRNT DISP (MISCELLANEOUS) ×1
APL SKNCLS STERI-STRIP NONHPOA (GAUZE/BANDAGES/DRESSINGS) ×1
BENZOIN TINCTURE PRP APPL 2/3 (GAUZE/BANDAGES/DRESSINGS) ×3 IMPLANT
BLADE SAW SAG 29X58X.64 (BLADE) IMPLANT
BLADE SAW SAG 73X25 THK (BLADE) ×2
BLADE SAW SGTL 73X25 THK (BLADE) IMPLANT
CEMENT BONE DEPUY (Cement) ×4 IMPLANT
CEMENT HV SMART SET (Cement) IMPLANT
CHLORAPREP W/TINT 26ML (MISCELLANEOUS) ×6 IMPLANT
CLOSURE STERI-STRIP 1/2X4 (GAUZE/BANDAGES/DRESSINGS) ×1
CLOSURE WOUND 1/2 X4 (GAUZE/BANDAGES/DRESSINGS) ×1
CLSR STERI-STRIP ANTIMIC 1/2X4 (GAUZE/BANDAGES/DRESSINGS) ×1 IMPLANT
COVER SURGICAL LIGHT HANDLE (MISCELLANEOUS) ×3 IMPLANT
COVER WAND RF STERILE (DRAPES) ×3 IMPLANT
DRAPE C-ARM 42X72 X-RAY (DRAPES) IMPLANT
DRAPE INCISE IOBAN 66X45 STRL (DRAPES) ×6 IMPLANT
DRAPE ORTHO SPLIT 77X108 STRL (DRAPES) ×6
DRAPE PROXIMA HALF (DRAPES) ×3 IMPLANT
DRAPE SURG ORHT 6 SPLT 77X108 (DRAPES) ×2 IMPLANT
DRAPE SWITCH (DRAPES) ×3 IMPLANT
DRSG AQUACEL AG ADV 3.5X 6 (GAUZE/BANDAGES/DRESSINGS) ×3 IMPLANT
ELECT BLADE 4.0 EZ CLEAN MEGAD (MISCELLANEOUS) ×3
ELECT CAUTERY BLADE 6.4 (BLADE) ×3 IMPLANT
ELECT REM PT RETURN 9FT ADLT (ELECTROSURGICAL) ×3
ELECTRODE BLDE 4.0 EZ CLN MEGD (MISCELLANEOUS) ×1 IMPLANT
ELECTRODE REM PT RTRN 9FT ADLT (ELECTROSURGICAL) ×1 IMPLANT
GLENOID CORTILOC AEQUALIS  L60 (Shoulder) ×4 IMPLANT
GLENOID CORTILOC AEQUALIS L60 (Shoulder) IMPLANT
GLOVE BIOGEL PI IND STRL 8 (GLOVE) ×1 IMPLANT
GLOVE BIOGEL PI INDICATOR 8 (GLOVE) ×2
GLOVE ECLIPSE 8.0 STRL XLNG CF (GLOVE) ×6 IMPLANT
GOWN STRL REUS W/ TWL LRG LVL3 (GOWN DISPOSABLE) ×1 IMPLANT
GOWN STRL REUS W/ TWL XL LVL3 (GOWN DISPOSABLE) ×1 IMPLANT
GOWN STRL REUS W/TWL LRG LVL3 (GOWN DISPOSABLE) ×3
GOWN STRL REUS W/TWL XL LVL3 (GOWN DISPOSABLE) ×3
GUIDEWIRE GLENOID 2.5X220 (WIRE) ×2 IMPLANT
HANDPIECE INTERPULSE COAX TIP (DISPOSABLE) ×3
HEAD HUM AEQ HO 50X19 (Head) ×2 IMPLANT
KIT BASIN OR (CUSTOM PROCEDURE TRAY) ×3 IMPLANT
KIT STABILIZATION SHOULDER (MISCELLANEOUS) ×3 IMPLANT
KIT TURNOVER KIT B (KITS) ×3 IMPLANT
MANIFOLD NEPTUNE II (INSTRUMENTS) ×3 IMPLANT
NDL HYPO 25GX1X1/2 BEV (NEEDLE) IMPLANT
NDL MAYO TROCAR (NEEDLE) ×1 IMPLANT
NEEDLE HYPO 25GX1X1/2 BEV (NEEDLE) IMPLANT
NEEDLE MAYO TROCAR (NEEDLE) ×3 IMPLANT
NS IRRIG 1000ML POUR BTL (IV SOLUTION) ×3 IMPLANT
PACK SHOULDER (CUSTOM PROCEDURE TRAY) ×3 IMPLANT
PAD ARMBOARD 7.5X6 YLW CONV (MISCELLANEOUS) ×6 IMPLANT
RESTRAINT HEAD UNIVERSAL NS (MISCELLANEOUS) ×3 IMPLANT
SET HNDPC FAN SPRY TIP SCT (DISPOSABLE) IMPLANT
SMARTMIX MINI TOWER (MISCELLANEOUS) ×6
SPONGE LAP 18X18 X RAY DECT (DISPOSABLE) ×3 IMPLANT
STEM HUMERAL AEQ 74X3A 127.5D (Stem) ×2 IMPLANT
STRIP CLOSURE SKIN 1/2X4 (GAUZE/BANDAGES/DRESSINGS) ×2 IMPLANT
SUCTION FRAZIER HANDLE 10FR (MISCELLANEOUS) ×2
SUCTION TUBE FRAZIER 10FR DISP (MISCELLANEOUS) ×1 IMPLANT
SUT ETHIBOND 2 V 37 (SUTURE) ×3 IMPLANT
SUT ETHIBOND NAB CT1 #1 30IN (SUTURE) ×3 IMPLANT
SUT FIBERWIRE #5 38 CONV NDL (SUTURE) ×18
SUT MON AB 3-0 SH 27 (SUTURE) ×3
SUT MON AB 3-0 SH27 (SUTURE) ×1 IMPLANT
SUT VIC AB 0 CT1 18XCR BRD 8 (SUTURE) ×1 IMPLANT
SUT VIC AB 0 CT1 8-18 (SUTURE) ×3
SUT VIC AB 2-0 CT1 27 (SUTURE) ×3
SUT VIC AB 2-0 CT1 TAPERPNT 27 (SUTURE) ×1 IMPLANT
SUTURE FIBERWR #5 38 CONV NDL (SUTURE) ×6 IMPLANT
TOWEL OR 17X26 10 PK STRL BLUE (TOWEL DISPOSABLE) ×3 IMPLANT
TOWER CARTRIDGE SMART MIX (DISPOSABLE) IMPLANT
TOWER SMARTMIX MINI (MISCELLANEOUS) IMPLANT
TRAY FOLEY W/BAG SLVR 14FR (SET/KITS/TRAYS/PACK) IMPLANT
WATER STERILE IRR 1000ML POUR (IV SOLUTION) ×1 IMPLANT

## 2018-12-17 NOTE — Progress Notes (Signed)
Dr. Ermalene Postin notified that patient's BP elevated and patient has not taken his BP meds in 7-10 days.  No new orders received.

## 2018-12-17 NOTE — Progress Notes (Signed)
1230 Received pt from PACU, A&O x4.  Left shoulder incision with Aquacel dressing dry and intact. Left arm sling on. Left fingers are numb but able to do strong grips. Denies surgical pain at this time.

## 2018-12-17 NOTE — Anesthesia Procedure Notes (Signed)
Procedure Name: Intubation Date/Time: 12/17/2018 8:50 AM Performed by: Kyung Rudd, CRNA Pre-anesthesia Checklist: Patient identified, Emergency Drugs available, Suction available and Patient being monitored Patient Re-evaluated:Patient Re-evaluated prior to induction Oxygen Delivery Method: Circle system utilized Preoxygenation: Pre-oxygenation with 100% oxygen Induction Type: IV induction Ventilation: Mask ventilation without difficulty and Two handed mask ventilation required Laryngoscope Size: Mac and 4 Grade View: Grade I Tube type: Oral Tube size: 7.5 mm Number of attempts: 1 Airway Equipment and Method: Stylet Placement Confirmation: ETT inserted through vocal cords under direct vision,  positive ETCO2 and breath sounds checked- equal and bilateral Secured at: 21 cm Tube secured with: Tape Dental Injury: Teeth and Oropharynx as per pre-operative assessment

## 2018-12-17 NOTE — Transfer of Care (Signed)
Immediate Anesthesia Transfer of Care Note  Patient: Jordan Baldwin  Procedure(s) Performed: TOTAL SHOULDER ARTHROPLASTY (Left )  Patient Location: PACU  Anesthesia Type:GA combined with regional for post-op pain  Level of Consciousness: awake, alert  and oriented  Airway & Oxygen Therapy: Patient Spontanous Breathing and Patient connected to nasal cannula oxygen  Post-op Assessment: Report given to RN and Post -op Vital signs reviewed and stable  Post vital signs: Reviewed and stable  Last Vitals:  Vitals Value Taken Time  BP    Temp    Pulse    Resp    SpO2      Last Pain:  Vitals:   12/17/18 0659  TempSrc:   PainSc: 7          Complications: No apparent anesthesia complications

## 2018-12-17 NOTE — Op Note (Addendum)
Orthopaedic Surgery Operative Note (CSN: [id number])  Jordan Baldwin  14-Nov-1946 Date of Surgery: 12/17/2018   Diagnoses:  OA LEFT SHOULDER  Procedure: Total shoulder arthroplasty left   Operative Finding Successful completion of planned procedure.  Patient had good fixation of glenoid in good repair of of the subscap osteotomy with an intact remainder of the cuff.  Were happy with the final results.  Patient had about 40 degrees of native glenoid retroversion which made the exposure somewhat difficult.  Additionally on implanting her initial glenoid there was soft tissue interposed underneath and were unable to extracted thus we had to put in a secondary glenoid without any detriment to the patient from a outcome standpoint with good fixation of the final implants.  Post-operative plan: The patient will be nonweightbearing in a sling.  The patient will be as overnight.  DVT prophylaxis indicated in ambulatory extremity patient.  Pain control with PRN pain medication preferring oral medicines.  Follow up plan will be scheduled in approximately 7 days for incision check and XR.  Post-Op Diagnosis: Same Surgeons:Primary: Hiram Gash, MD Assistants: Joya Gaskins OPAC Location: [facility] OR ROOM 06 Anesthesia: Choice Antibiotics: Ancef 2g preop, Vancomycin 1000mg  locally  Tourniquet time: * No tourniquets in log * Estimated Blood Loss: 528 Complications: None Specimens: None Implants: Implant Name Type Inv. Item Serial No. Manufacturer Lot No. LRB No. Used Action  CEMENT BONE DEPUY - [id number] Cement CEMENT BONE DEPUY  DEPUY SYNTHES [id number] Left 1 Implanted  CEMENT BONE DEPUY - [id number] Cement CEMENT BONE DEPUY  DEPUY SYNTHES [id number] Left 1 Implanted  GLENOID CORTILOC AEQUALIS  L60 - [id number] Shoulder GLENOID [employer] [id number] Left 1 Implanted  HEAD HUM AEQ HO 50X19 - [id number] Head HEAD HUM AEQ HO 50X19  [employer] [id number] Left 1 Implanted  STEM HUMERAL AEQ  74X3A 127.5D - [id number] Stem STEM HUMERAL AEQ 74X3A 127.5D  Jettie Pagan [id number] Left 1 Implanted    Indications for Surgery:   Jordan Baldwin is a 72 y.o. male with end-stage bone-on-bone left shoulder arthritis with some mild posterior erosion and increased humeral retroversion.  Benefits and risks of operative and nonoperative management were discussed prior to surgery with patient/guardian(s) and informed consent form was completed.  Specific risks including infection, need for additional surgery.  We additionally specifically discussed risks of axillary nerve injury, infection, periprosthetic fracture, continued pain and longevity of implants prior to beginning procedure.      Procedure:   The patient was identified in the preoperative holding area where the surgical site was marked. The patient was taken to the OR where a procedural timeout was called and the above noted anesthesia was induced.  The patient was positioned beachchair on allen table.  Preoperative antibiotics were dosed.  The patient's left shoulder was prepped and draped in the usual sterile fashion.  A second preoperative timeout was called.      Standard deltopectoral approach was performed with a #10 blade. We dissected down to the subcutaneous tissues and the cephalic vein was taken laterally with the deltoid. Clavipectoral fascia was incised in line with the incision. Deep retractors were placed. The long of the biceps tendon was identified and there was significant tenosynovitis present.  Tenodesis was performed to the pectoralis tendon with #2 Ethibond. The remaining biceps was followed up into the rotator interval where it was released. The subscapularis was taken down with a lesser tuberosity osteotomy using an osteotome. The osteotomy fragment and underlying  capsular elevated off of the humeral neck and the osteophytes inferiorly. #2 Ethibond sutures are passed through the bone tendon junction for subscap manipulation. We  continued releasing the capsule directly off of the osteophytes inferiorly all the way around the corner. This allowed [place] to dislocate the humeral head. The humeral head had evidence of severe osteoarthritic wear with full-thickness cartilage loss and exposed subchondral bone. There was significant flattening of the humeral head.   The rotator cuff was carefully examined and noted to be intact without sign of wear.  The decision was confirmed that an anatomic total shoulder was indicated for this patient.  There were osteophytes along the inferior humeral neck. The osteophytes were removed with an osteotome and a rongeur.  Osteophytes were removed with a rongeur and an osteotome and the anatomic neck was well visualized.   We next made our humeral osteotomy with an oscillating saw along the anatomic neck. The head fragment was passed off the back table and measured approximately 50 mm in diameter. A starter awl was used to open the humeral canal. We reamed with T-handle sound  reamers up an appropriate fit. The chisel was used to remove proximal humeral bone. We then broached starting with a size 1 broach increasing to size 3 which had secure and stable fit. The inclination was set at A. The broach handle was removed and a cut protector was placed. The humerus was retracted posteriorly and we turned our attention to glenoid exposure.  The subscapularis was again identified and immediately we took care to palpate the axillary nerve anteriorly and verify its position with gentle palpation as well as the tug test.  We then released the SGHL with bovie cautery prior to placing a curved mayo at the junction of the anterior glenoid well above the axillary nerve and bluntly dissecting the subscapularis from the capsule.  We then carefully protected the axillary nerve as we gently released the inferior capsule to fully mobilize the subscapularis.  An anterior deltoid retractor was then placed as well as a small  Hohmann retractor superiorly.    The glenoid was inspected and had evidence of severe osteoarthritic wear with full-thickness cartilage loss and exposed subchondral bone. The remaining labrum was removed circumferentially taking great care not to disrupt the posterior capsule. The glenoid was sized as listed in the implants above. The center hole was marked and we used a glenoid drill guide to place a guide pin in the center position.  The glenoid was reamed concentrically over the guide pin. Next the center hole was enlarged and the drill guide for the peripheral pegs was placed. The vault was intact. The peripheral pegs were drilled in standard fashion. A trial glenoid was placed and was stable. We removed the trial components.   The glenoid bone was prepared with pulsatile lavage and a sponge to dry the bone prior to cement applicatio. Cement was mixed on the back table the peripheral peg holes were cemented.  We initially placed the glenoid and unfortunately it seemed to have posterior rock and we found that there was interposed posterior soft tissue that were unable to extract without removing the implant.  We thus quickly remove the implant without detriment and re-cemented and placed another implant of the same size after clearing the soft tissue.  The glenoid was impacted securely.   We turned attention back to the humeral side. The cut protector was removed. We trialed with multiple size head options and selected a 51 high offset  which re-created the patient's anatomy. The offset was dialed in to match the normal anatomy. The shoulder was trialed.  There was good ROM in all planes and the shoulder was stable with approximately 50% posterior spring back and no inferior translation.  The real humeral implants were opened on the back table and assembled.  The trial was removed. #5 Fiberwire sutures passed through the humeral neck for subscap repair. The humeral component was press-fit obtaining a  secure fit.  The joint was reduced and thoroughly irrigated with pulsatile lavage. Subscap and lesser tuberosity osteotomy repaired back in a double row fashion with #5 Fiberwire sutures through bone tunnels. Next the rotator interval was closed with #2 ethibond suture. Hemostasis was obtained. The deltopectoral interval was reapproximated with #1 Ethibond. The subcutaneous tissues were closed with 2-0 Vicryl and the skin was closed with a running monocryl. The incisions were cleaned and dried and an Aquacel dressing was placed. The drapes taken down. The arm was placed into sling with abduction pillow. Patient was awakened, extubated, and transferred to the recovery room in stable condition. There were no intraoperative complications. The sponge, needle, and attention counts were correct at the end of the case.    The incision was thoroughly irrigated and closed in a multilayer fashion with absorbable sutures. A sterile dressing was placed.   The patient was awoken from general anesthesia and taken to the PACU in stable condition without complication.    Joya Gaskins, OPA-C, present and scrubbed throughout the case, critical for completion in a timely fashion, and for retraction, instrumentation, closure.

## 2018-12-17 NOTE — Progress Notes (Signed)
Orthopedic Tech Progress Note Patient Details:  Jordan Baldwin [birth year] [id number]  Ortho Devices Type of Ortho Device: Shoulder abduction pillow Ortho Device/Splint Location: delivered to OR Ortho Device/Splint Interventions: Renard Matter 12/17/2018, 11:04 AM

## 2018-12-17 NOTE — Anesthesia Preprocedure Evaluation (Signed)
Anesthesia Evaluation  Patient identified by MRN, date of birth, ID band Patient awake    Reviewed: Allergy & Precautions, NPO status , Patient's Chart, lab work & pertinent test results  History of Anesthesia Complications Negative for: history of anesthetic complications  Airway Mallampati: IV  TM Distance: >3 FB Neck ROM: Full    Dental  (+) Teeth Intact   Pulmonary shortness of breath, former smoker,    breath sounds clear to auscultation       Cardiovascular hypertension, Pt. on medications (-) angina(-) Past MI and (-) CHF  Rhythm:Regular     Neuro/Psych negative neurological ROS  negative psych ROS   GI/Hepatic PUD,   Endo/Other  Morbid obesity  Renal/GU Renal disease     Musculoskeletal  (+) Arthritis ,   Abdominal   Peds  Hematology   Anesthesia Other Findings   Reproductive/Obstetrics                             Anesthesia Physical Anesthesia Plan  ASA: II  Anesthesia Plan: General and Regional   Post-op Pain Management:  Regional for Post-op pain   Induction: Intravenous  PONV Risk Score and Plan: 2 and Ondansetron and Dexamethasone  Airway Management Planned: Oral ETT  Additional Equipment: None  Intra-op Plan:   Post-operative Plan: Extubation in OR  Informed Consent: I have reviewed the patients History and Physical, chart, labs and discussed the procedure including the risks, benefits and alternatives for the proposed anesthesia with the patient or authorized representative who has indicated his/her understanding and acceptance.   Dental advisory given  Plan Discussed with: CRNA and Surgeon  Anesthesia Plan Comments:         Anesthesia Quick Evaluation

## 2018-12-17 NOTE — H&P (Signed)
PREOPERATIVE H&P  Chief Complaint: OA LEFT SHOULDER  HPI: Jordan Baldwin is a 72 y.o. male who presents for preoperative history and physical with a diagnosis of OA LEFT SHOULDER. Symptoms are rated as moderate to severe, and have been worsening.  This is significantly impairing activities of daily living.  Please see my clinic note for full details on this patient's care.  He has elected for surgical management.   Past Medical History:  Diagnosis Date  . ANEMIA-NOS 11/29/2008   Qualifier: Diagnosis of  By: Linna Darner MD, [place] INSUFFICIENCY 10/18/2010   Qualifier: Diagnosis of  By: Ron Parker, MD, Leonidas Romberg Dorinda Hill   . Arthritis    "hands, feet, shoulders, back" (07/29/2013)  . ARTHROSCOPY, RIGHT KNEE, HX OF 11/29/2008   Qualifier: Diagnosis of  By: Emmaline Kluver CMA, [place]    . Atelectasis of right lung 07/28/2013  . Basal cell cancer    "neck, right arm" (07/29/2013)  . Fracture of rib of right side 07/28/2013  . Heart murmur   . Hemothorax on right 07/28/2013   Associated with fall 7 days prior to admission and right 10th rib fracture  . High cholesterol   . History of kidney stones   . HYPERLIPIDEMIA 11/29/2008   Qualifier: Diagnosis of  By: Linna Darner MD, Gwyndolyn Saxon    . Hypertension   . IBS 11/29/2008   Qualifier: Diagnosis of  By: Emmaline Kluver CMA, [place]    . Kidney stones    "passed them all" (07/29/2013)  . NEOPLASM, SKIN 11/29/2008   Qualifier: Diagnosis of  By: Emmaline Kluver CMA, [place]    . OVERWEIGHT 10/18/2010   Qualifier: Diagnosis of  By: Ron Parker, MD, Caffie Damme   . PEPTIC ULCER DISEASE 11/29/2008   Qualifier: Diagnosis of  By: Emmaline Kluver CMA, [place]    . RENAL CALCULUS, HX OF 11/29/2008   Qualifier: Diagnosis of  By: Emmaline Kluver CMA, [place]    . Shortness of breath    "not before I broke my rib" (07/29/2013)   Past Surgical History:  Procedure Laterality Date  . ADENOIDECTOMY    . CHEST TUBE INSERTION Right 07/28/2013  . CHOLECYSTECTOMY    . COLONOSCOPY WITH ESOPHAGOGASTRODUODENOSCOPY  (EGD)    . ELBOW SURGERY Right    "2 ORs:  lateral detachment of the muscle; clean out bone chips and calcium deposits" (07/29/2013)  . KNEE ARTHROSCOPY Right   . TONSILLECTOMY AND ADENOIDECTOMY    . VIDEO ASSISTED THORACOSCOPY (VATS)/THOROCOTOMY Right 07/30/2013   Procedure: VIDEO ASSISTED THORACOSCOPY (VATS)/THOROCOTOMY;  Surgeon: Rexene Alberts, MD;  Location: [facility];  Service: Thoracic;  Laterality: Right;  evacuation of hemothorax   Social History   Socioeconomic History  . Marital status: Married    Spouse name: Not on file  . Number of children: Not on file  . Years of education: Not on file  . Highest education level: Not on file  Occupational History  . Not on file  Social Needs  . Financial resource strain: Not on file  . Food insecurity:    Worry: Not on file    Inability: Not on file  . Transportation needs:    Medical: Not on file    Non-medical: Not on file  Tobacco Use  . Smoking status: Former Smoker    Packs/day: 0.25    Years: 1.00    Pack years: 0.25    Types: Cigarettes  . Smokeless tobacco: Never Used  . Tobacco comment: 07/29/2013 "smoked cigarettes less than 1 yr; I sold  them; don't remember when I quit"  Substance and Sexual Activity  . Alcohol use: Yes    Comment: very rarely  . Drug use: No  . Sexual activity: Not Currently  Lifestyle  . Physical activity:    Days per week: Not on file    Minutes per session: Not on file  . Stress: Not on file  Relationships  . Social connections:    Talks on phone: Not on file    Gets together: Not on file    Attends religious service: Not on file    Active member of club or organization: Not on file    Attends meetings of clubs or organizations: Not on file    Relationship status: Not on file  Other Topics Concern  . Not on file  Social History Narrative  . Not on file   History reviewed. No pertinent family history. No Known Allergies Prior to Admission medications   Medication Sig Start Date End  Date Taking? Authorizing Provider  ibuprofen (ADVIL,MOTRIN) 200 MG tablet Take 400 mg by mouth every 6 (six) hours as needed for headache or moderate pain.   Yes [provider]  losartan (COZAAR) 25 MG tablet Take 1 tablet (25 mg total) by mouth daily. 10/16/18  Yes Josue Hector, MD  meloxicam (MOBIC) 7.5 MG tablet Take 7.5 mg by mouth daily.   Yes [provider]  vitamin B-12 (CYANOCOBALAMIN) 100 MCG tablet Take 100 mcg by mouth daily.   Yes [provider]  tamsulosin (FLOMAX) 0.4 MG CAPS capsule Take 0.4 mg by mouth at bedtime.    [provider]     Positive ROS: All other systems have been reviewed and were otherwise negative with the exception of those mentioned in the HPI and as above.  Physical Exam: General: Alert, no acute distress Cardiovascular: No pedal edema Respiratory: No cyanosis, no use of accessory musculature GI: No organomegaly, abdomen is soft and non-tender Skin: No lesions in the area of chief complaint Neurologic: Sensation intact distally Psychiatric: Patient is competent for consent with normal mood and affect Lymphatic: No axillary or cervical lymphadenopathy  MUSCULOSKELETAL: L shoulder painful ROM, cuff intact  Assessment: OA LEFT SHOULDER  Plan: Plan for Procedure(s): TOTAL SHOULDER ARTHROPLASTY  The risks benefits and alternatives were discussed with the patient including but not limited to the risks of nonoperative treatment, versus surgical intervention including infection, bleeding, nerve injury,  blood clots, cardiopulmonary complications, morbidity, mortality, among others, and they were willing to proceed.    We additionally specifically discussed risks of axillary nerve injury, infection, periprosthetic fracture, continued pain and longevity of implants prior to beginning procedure.    Hiram Gash, MD  12/17/2018 7:09 AM

## 2018-12-18 ENCOUNTER — Encounter (HOSPITAL_COMMUNITY): Payer: Self-pay | Admitting: Orthopaedic Surgery

## 2018-12-18 MED ORDER — ACETAMINOPHEN 500 MG PO TABS: 1000.0000 mg | ORAL_TABLET | Freq: Three times a day (TID) | ORAL | 0 refills | Status: AC

## 2018-12-18 MED ORDER — MELOXICAM 7.5 MG PO TABS: 7.5000 mg | ORAL_TABLET | Freq: Every day | ORAL | 2 refills | Status: DC

## 2018-12-18 MED ORDER — OXYCODONE HCL 5 MG PO TABS: ORAL_TABLET | ORAL | 0 refills | Status: AC

## 2018-12-18 MED ORDER — ONDANSETRON HCL 4 MG PO TABS: 4.0000 mg | ORAL_TABLET | Freq: Three times a day (TID) | ORAL | 1 refills | Status: AC | PRN

## 2018-12-18 NOTE — Anesthesia Postprocedure Evaluation (Signed)
Anesthesia Post Note  Patient: Jordan Baldwin  Procedure(s) Performed: TOTAL SHOULDER ARTHROPLASTY (Left )     Patient location during evaluation: PACU Anesthesia Type: Regional and General Level of consciousness: awake and alert Pain management: pain level controlled Vital Signs Assessment: post-procedure vital signs reviewed and stable Respiratory status: spontaneous breathing, nonlabored ventilation, respiratory function stable and patient connected to nasal cannula oxygen Cardiovascular status: blood pressure returned to baseline and stable Postop Assessment: no apparent nausea or vomiting Anesthetic complications: no    Last Vitals:  Vitals:   12/18/18 0458 12/18/18 0907  BP: (!) 146/85 (!) 148/82  Pulse: (!) 59 63  Resp: 18 18  Temp: 36.6 C 36.8 C  SpO2: 94% 97%    Last Pain:  Vitals:   12/18/18 0907  TempSrc: Oral  PainSc:                  Jordan Baldwin

## 2018-12-18 NOTE — Discharge Summary (Signed)
Patient ID: Jordan Baldwin MRN: [id number] DOB/AGE: [birth year] 72 y.o.  Admit date: 12/17/2018 Discharge date: 12/18/2018  Admission Diagnoses:L shoulder end stage arthritis  Discharge Diagnoses:  Active Problems:   Degenerative arthritis of left shoulder region   Past Medical History:  Diagnosis Date  . ANEMIA-NOS 11/29/2008   Qualifier: Diagnosis of  By: Linna Darner MD, [place] INSUFFICIENCY 10/18/2010   Qualifier: Diagnosis of  By: Ron Parker, MD, Leonidas Romberg Dorinda Hill   . Arthritis    "hands, feet, shoulders, back" (07/29/2013)  . ARTHROSCOPY, RIGHT KNEE, HX OF 11/29/2008   Qualifier: Diagnosis of  By: Emmaline Kluver CMA, Chrae    . Atelectasis of right lung 07/28/2013  . Basal cell cancer    "neck, right arm" (07/29/2013)  . Fracture of rib of right side 07/28/2013  . Heart murmur   . Hemothorax on right 07/28/2013   Associated with fall 7 days prior to admission and right 10th rib fracture  . High cholesterol   . History of kidney stones   . HYPERLIPIDEMIA 11/29/2008   Qualifier: Diagnosis of  By: Linna Darner MD, Gwyndolyn Saxon    . Hypertension   . IBS 11/29/2008   Qualifier: Diagnosis of  By: Emmaline Kluver CMA, Chrae    . Kidney stones    "passed them all" (07/29/2013)  . NEOPLASM, SKIN 11/29/2008   Qualifier: Diagnosis of  By: Emmaline Kluver CMA, Chrae    . OVERWEIGHT 10/18/2010   Qualifier: Diagnosis of  By: Ron Parker, MD, Caffie Damme   . PEPTIC ULCER DISEASE 11/29/2008   Qualifier: Diagnosis of  By: Emmaline Kluver CMA, Chrae    . RENAL CALCULUS, HX OF 11/29/2008   Qualifier: Diagnosis of  By: Emmaline Kluver CMA, Chrae    . Shortness of breath    "not before I broke my rib" (07/29/2013)     Procedures Performed: Left total shoulder arthroplasty  Discharged Condition: good  Hospital Course: Patient brought in as an outpatient for surgery.  Tolerated procedure well.  Was kept for monitoring overnight for pain control and medical monitoring postop and was found to be stable for DC home the morning after  surgery.  Patient was instructed on specific activity restrictions and all questions were answered.   Consults: None  Significant Diagnostic Studies: No additional pertinent studies  Treatments: Surgery  Discharge Exam:  Dressing CDI and sling well fitting,  full and painless ROM throughout hand with DPC of 0.  Axillary nerve sensation/motor altered in setting of block and unable to be fully tested.  Distal motor and sensory altered in setting of block.     Disposition: Discharge disposition: 01-Home or Self Care       Discharge Instructions    Call MD for:  persistant nausea and vomiting   Complete by:  As directed    Call MD for:  redness, tenderness, or signs of infection (pain, swelling, redness, odor or green/yellow discharge around incision site)   Complete by:  As directed    Call MD for:  severe uncontrolled pain   Complete by:  As directed    Diet - low sodium heart healthy   Complete by:  As directed    Discharge instructions   Complete by:  As directed    Ophelia Charter MD, MPH [place]. [address] [phone] (tel)   [phone] (fax)   [place] may leave the operative dressing in place until your follow-up  appointment. KEEP THE INCISIONS CLEAN AND DRY. Use the Cryocuff, GameReady or Ice as often as possible for the first 3-4 days, then as needed for pain relief.  You may shower on Post-Op Day #2. The dressing is water resistant but do not scrub it as it may start to peel up.  You may remove the sling for showering, but keep a water resistant pillow under the arm to keep both the elbow and shoulder away from the body (mimicking the abduction sling). Gently pat the area dry. Do not soak the shoulder in water. Do not go swimming in the pool or ocean until your sutures are removed.  EXERCISES Wear the sling at all times except when doing your exercises. You  may remove the sling for showering, but keep the arm across the chest or in a secondary sling.   Accidental/Purposeful External Rotation and shoulder flexion (reaching behind you) is to be avoided at all costs for the first month. Please perform the exercises:   Elbow / Hand / Wrist  Range of Motion Exercises POST-OP A multi-modal approach will be used to treat your pain. Oxycodone - This is a strong narcotic, to be used only on an "as needed" basis for pain. Meloxicam- An anti-inflammatory medication Acetaminophen - A non-narcotic pain medicine.  Use 1000mg  three times a day for the first 14 days after surgery If you have any adverse effects with the medications, please call our office.  FOLLOW-UP If you develop a Fever (>101.5), Redness or Drainage from the surgical incision site, please call our office to arrange for an evaluation. Please call the office to schedule a follow-up appointment for a wound check, 7-10 days post-operatively.    IF YOU HAVE ANY QUESTIONS, PLEASE FEEL FREE TO CALL OUR OFFICE.   HELPFUL INFORMATION  Your arm will be in a sling following surgery. You will be in this sling for the next 3-4 weeks.  I will let you know the exact duration at your follow-up visit.  You may be more comfortable sleeping in a semi-seated position the first few nights following surgery.  Keep a pillow propped under the elbow and forearm for comfort.  If you have a recliner type of chair it might be beneficial.  If not that is fine too, but it would be helpful to sleep propped up with pillows behind your operated shoulder as well under your elbow and forearm.  This will reduce pulling on the suture lines.  We suggest you use the pain medication the first night prior to going to bed, in order to ease any pain when the anesthesia wears off. You should avoid taking pain medications on an empty stomach as it will make you nauseous.  Do not drink alcoholic beverages or take illicit drugs when  taking pain medications.  In most states it is against the law to drive while your arm is in a sling. And certainly against the law to drive while taking narcotics.  You may return to work/school in the next couple of days when you feel up to it. Desk work and typing in the sling is fine.  When dressing, put your operative arm in the sleeve first.  When getting undressed, take your operative arm out last.  Loose fitting, button-down shirts are recommended.  Pain medication may make you constipated.  Below are a few solutions to try in this order: Decrease the amount of pain medication if you aren't having pain. Drink lots of decaffeinated fluids. Drink prune juice  and/or each dried prunes  If the first 3 don't work start with additional solutions Take Colace - an over-the-counter stool softener Take Senokot - an over-the-counter laxative Take Miralax - a stronger over-the-counter laxative   Increase activity slowly   Complete by:  As directed      Allergies as of 12/18/2018   No Known Allergies     Medication List    STOP taking these medications   ibuprofen 200 MG tablet Commonly known as:  ADVIL,MOTRIN     TAKE these medications   acetaminophen 500 MG tablet Commonly known as:  TYLENOL Take 2 tablets (1,000 mg total) by mouth every 8 (eight) hours for 14 days.   losartan 25 MG tablet Commonly known as:  COZAAR Take 1 tablet (25 mg total) by mouth daily.   meloxicam 7.5 MG tablet Commonly known as:  MOBIC Take 1 tablet (7.5 mg total) by mouth daily.   ondansetron 4 MG tablet Commonly known as:  ZOFRAN Take 1 tablet (4 mg total) by mouth every 8 (eight) hours as needed for up to 7 days for nausea or vomiting.   oxyCODONE 5 MG immediate release tablet Commonly known as:  Oxy IR/ROXICODONE Take 1-2 pills every 6 hrs as needed for pain, no more than 6 per day   tamsulosin 0.4 MG Caps capsule Commonly known as:  FLOMAX Take 0.4 mg by mouth at bedtime.   vitamin  B-12 100 MCG tablet Commonly known as:  CYANOCOBALAMIN Take 100 mcg by mouth daily.

## 2018-12-18 NOTE — Evaluation (Signed)
Occupational Therapy Evaluation and Discharge Patient Details Name: Jordan Baldwin MRN: [id number] DOB: Nov 06, 1946 Today's Date: 12/18/2018    History of Present Illness pt is s/p Left total shoulder arthroplasty   Clinical Impression   This 72 yo male admitted with above presents to acute OT with all education completed, we will D/C from acute OT.     Follow Up Recommendations  No OT follow up    Equipment Recommendations  None recommended by OT       Precautions / Restrictions Precautions Precautions: Shoulder Shoulder Interventions: Shoulder abduction pillow;Off for dressing/bathing/exercises Precaution Booklet Issued: Yes (comment) Required Braces or Orthoses: Sling Restrictions Weight Bearing Restrictions: Yes LUE Weight Bearing: Non weight bearing      Mobility Bed Mobility               General bed mobility comments: Pt up in recliner upon my arrival and plans on sleeping in recliner at home  Transfers Overall transfer level: Independent                        ADL either performed or assessed with clinical judgement   ADL                                         General ADL Comments: We did talk about toileting hygiene with pt using his right arm (usually uses his left arm for this). I recommended wet wipes, bottom buddy, add on bidet. He thought that he may be able to do it if he can put is foot up on something (he said vanity--I felt like this may be a little high for him to try , he said he had a step stool--this would be a safer option)     Vision Patient Visual Report: No change from baseline              Pertinent Vitals/Pain Pain Assessment: No/denies pain     Hand Dominance Right   Extremity/Trunk Assessment Upper Extremity Assessment Upper Extremity Assessment: LUE deficits/detail LUE Deficits / Details: shoulder sx this admission, elbow distally AROM WNL LUE Coordination: decreased gross motor            Communication Communication Communication: No difficulties   Cognition Arousal/Alertness: Awake/alert   Overall Cognitive Status: Within Functional Limits for tasks assessed                                        Exercises Other Exercises Other Exercises: Pt performed 10 reps each of elbow, wrist, forearm, and hand exercises. Educated on doing these 3x/day 10reps each   Shoulder Instructions Shoulder Instructions Donning/doffing shirt without moving shoulder: Caregiver independent with task Method for sponge bathing under operated UE: (pt and caregiver verbalized understanding) Donning/doffing sling/immobilizer: Caregiver independent with task Correct positioning of sling/immobilizer: Caregiver independent with task Pendulum exercises (written home exercise program): (NA) ROM for elbow, wrist and digits of operated UE: Supervision/safety Sling wearing schedule (on at all times/off for ADL's): Independent Proper positioning of operated UE when showering: Independent Dressing change: (NA) Positioning of UE while sleeping: Caregiver independent with task    Home Living Family/patient expects to be discharged to:: Private residence Living Arrangements: Spouse/significant other Available Help at Discharge: Family;Available 24 hours/day  Home Equipment: None          Prior Functioning/Environment Level of Independence: Independent                 OT Problem List: Decreased strength;Decreased range of motion;Impaired UE functional use         OT Goals(Current goals can be found in the care plan section) Acute Rehab OT Goals Patient Stated Goal: home today  OT Frequency:                AM-PAC OT "6 Clicks" Daily Activity     Outcome Measure Help from another person eating meals?: A Little Help from another person taking care of personal grooming?: A Little Help from another person toileting, which includes  using toliet, bedpan, or urinal?: A Little Help from another person bathing (including washing, rinsing, drying)?: A Lot Help from another person to put on and taking off regular upper body clothing?: A Lot Help from another person to put on and taking off regular lower body clothing?: A Lot 6 Click Score: 15   End of Session Nurse Communication: (pt ready to go from OT standpoint)  Activity Tolerance: Patient tolerated treatment well Patient left: in chair;with family/visitor present  OT Visit Diagnosis: Muscle weakness (generalized) (M62.81)                Time: 1856-3149 OT Time Calculation (min): 26 min Charges:  OT General Charges $OT Visit: 1 Visit OT Evaluation $OT Eval Moderate Complexity: 1 Mod OT Treatments $Self Care/Home Management : 8-22 mins  Golden Circle, OTR/L Acute [employer] Pager [phone] Office [phone]     Almon Register 12/18/2018, 11:00 AM

## 2018-12-18 NOTE — Progress Notes (Signed)
Pt is ambulating in the room. Left arm abductor/sling on. Mild pain noted. Discharge instructions given to pt, discharged to home accompanied by spouse.

## 2018-12-18 NOTE — Progress Notes (Signed)
OT Cancellation Note  Patient Details Name: Jordan Baldwin MRN: [id number] DOB: Jul 18, 1946   Cancelled Treatment:    Reason Eval/Treat Not Completed: Other (comment). Pt lives with wife and he would like his wife here to listen and see ADL education and elbow, wrist, hand exercises. Will see later today once wife arrives. Golden Circle, OTR/L Acute Rehab Services Pager [phone] Office [phone]     Almon Register 12/18/2018, 8:32 AM

## 2018-12-18 NOTE — Plan of Care (Signed)

## 2019-01-01 MED ORDER — BUPIVACAINE LIPOSOME 1.3 % IJ SUSP
INTRAMUSCULAR | Status: DC | PRN
  Administered 2018-12-17: 133 mg via PERINEURAL

## 2019-01-01 MED ORDER — BUPIVACAINE HCL (PF) 0.5 % IJ SOLN
INTRAMUSCULAR | Status: DC | PRN
  Administered 2018-12-17: 15 mL via PERINEURAL

## 2019-01-01 NOTE — Addendum Note (Signed)
Addendum  created 01/01/19 1842 by Oleta Mouse, MD   Child order released for a procedure order, Clinical Note Signed, Intraprocedure Blocks edited, Intraprocedure Meds edited

## 2019-01-01 NOTE — Anesthesia Procedure Notes (Signed)
Anesthesia Regional Block: Interscalene brachial plexus block   Pre-Anesthetic Checklist: ,, timeout performed, Correct Patient, Correct Site, Correct Laterality, Correct Procedure, Correct Position, site marked, Risks and benefits discussed,  Surgical consent,  Pre-op evaluation,  At surgeon's request and post-op pain management  Laterality: Left and Upper  Prep: chloraprep       Needles:  Injection technique: Single-shot     Needle Length: 5cm  Needle Gauge: 22     Additional Needles: Arrow StimuQuik ECHO Echogenic Stimulating PNB Needle  Procedures:,,,, ultrasound used (permanent image in chart),,,,  Narrative:  Start time: 12/17/2018 8:09 AM End time: 12/17/2018 8:15 AM Injection made incrementally with aspirations every 5 mL.  Performed by: Personally  Anesthesiologist: Oleta Mouse, MD

## 2019-01-12 NOTE — Progress Notes (Signed)
Cardiology Office Note   Date:  01/16/2019   ID:  Jordan Baldwin, DOB Sep 09, 1946, MRN [id number]  PCP:  [facility]  Cardiologist:   Jenkins Rouge, MD   No chief complaint on file.     History of Present Illness: Jordan Baldwin is a 73 y.o. male seen f/u HTN, aortic root dilatation Distant History of traumatic righ tlung hemothorax with chest tube. BP improved on ARB. CTA done . 11/20/19 moderate CAD calcium score 413 66 th percentile  Aortic root 4.0 cm He has been hesitant to start statin despite above average  Calcium score. History of murmur TTE reviewed from 10/27/18 mild AS mean gradient 12 mmHg peak 18 mmHg Aortic root was 4.1 cm   Had left total shoulder replacement with Dr Joanie Coddington Rehab going well Passed a kidney stone earlier this week  BP been running high at home   Past Medical History:  Diagnosis Date  . ANEMIA-NOS 11/29/2008   Qualifier: Diagnosis of  By: Linna Darner MD, [place] INSUFFICIENCY 10/18/2010   Qualifier: Diagnosis of  By: Ron Parker, MD, Leonidas Romberg Dorinda Hill   . Arthritis    "hands, feet, shoulders, back" (07/29/2013)  . ARTHROSCOPY, RIGHT KNEE, HX OF 11/29/2008   Qualifier: Diagnosis of  By: Emmaline Kluver CMA, Chrae    . Atelectasis of right lung 07/28/2013  . Basal cell cancer    "neck, right arm" (07/29/2013)  . Fracture of rib of right side 07/28/2013  . Heart murmur   . Hemothorax on right 07/28/2013   Associated with fall 7 days prior to admission and right 10th rib fracture  . High cholesterol   . History of kidney stones   . HYPERLIPIDEMIA 11/29/2008   Qualifier: Diagnosis of  By: Linna Darner MD, Gwyndolyn Saxon    . Hypertension   . IBS 11/29/2008   Qualifier: Diagnosis of  By: Emmaline Kluver CMA, Chrae    . Kidney stones    "passed them all" (07/29/2013)  . NEOPLASM, SKIN 11/29/2008   Qualifier: Diagnosis of  By: Emmaline Kluver CMA, Chrae    . OVERWEIGHT 10/18/2010   Qualifier: Diagnosis of  By: Ron Parker, MD, Caffie Damme   . PEPTIC ULCER DISEASE  11/29/2008   Qualifier: Diagnosis of  By: Emmaline Kluver CMA, Chrae    . RENAL CALCULUS, HX OF 11/29/2008   Qualifier: Diagnosis of  By: Emmaline Kluver CMA, Chrae    . Shortness of breath    "not before I broke my rib" (07/29/2013)    Past Surgical History:  Procedure Laterality Date  . ADENOIDECTOMY    . CHEST TUBE INSERTION Right 07/28/2013  . CHOLECYSTECTOMY    . COLONOSCOPY WITH ESOPHAGOGASTRODUODENOSCOPY (EGD)    . ELBOW SURGERY Right    "2 ORs:  lateral detachment of the muscle; clean out bone chips and calcium deposits" (07/29/2013)  . KNEE ARTHROSCOPY Right   . TONSILLECTOMY AND ADENOIDECTOMY    . TOTAL SHOULDER ARTHROPLASTY Left 12/17/2018  . TOTAL SHOULDER ARTHROPLASTY Left 12/17/2018   Procedure: TOTAL SHOULDER ARTHROPLASTY;  Surgeon: Hiram Gash, MD;  Location: [facility];  Service: Orthopedics;  Laterality: Left;  Marland Kitchen VIDEO ASSISTED THORACOSCOPY (VATS)/THOROCOTOMY Right 07/30/2013   Procedure: VIDEO ASSISTED THORACOSCOPY (VATS)/THOROCOTOMY;  Surgeon: Rexene Alberts, MD;  Location: [facility];  Service: Thoracic;  Laterality: Right;  evacuation of hemothorax     Current Outpatient Medications  Medication Sig Dispense Refill  . losartan (COZAAR) 25 MG tablet Take 1 tablet (25 mg total) by mouth daily. 90 tablet  3  . meloxicam (MOBIC) 7.5 MG tablet Take 1 tablet (7.5 mg total) by mouth daily. 30 tablet 2  . tamsulosin (FLOMAX) 0.4 MG CAPS capsule Take 0.4 mg by mouth at bedtime.    . vitamin B-12 (CYANOCOBALAMIN) 100 MCG tablet Take 100 mcg by mouth daily.     No current facility-administered medications for this visit.     Allergies:   Patient has no known allergies.    Social History:  The patient  reports that he has quit smoking. His smoking use included cigarettes. He has a 0.25 pack-year smoking history. He has never used smokeless tobacco. He reports current alcohol use. He reports that he does not use drugs.   Family History:  The patient's family history is not on file.    ROS:   Please see the history of present illness.   Otherwise, review of systems are positive for none.   All other systems are reviewed and negative.    PHYSICAL EXAM: VS:  BP (!) 172/88   Pulse 60   Ht 5\' 10"  (1.778 m)   Wt 257 lb (116.6 kg)   SpO2 96%   BMI 36.88 kg/m  , BMI Body mass index is 36.88 kg/m. Affect appropriate Overweight white male  HEENT: normal Neck supple with no adenopathy JVP normal no bruits no thyromegaly Lungs clear with no wheezing and good diaphragmatic motion Heart:  S1/S2 SEM  murmur, no rub, gallop or click PMI normal Abdomen: benighn, BS positve, no tenderness, no AAA no bruit.  No HSM or HJR Distal pulses intact with no bruits No edema Neuro non-focal Skin warm and dry Restricted left shoulder motion     EKG:  2014 SR rate 78 ICRBBB   10/16/18 SR rate 55 ICRBBB LVH    Recent Labs: 12/09/2018: BUN 12; Creatinine, Ser 0.87; Hemoglobin 14.4; Platelets 249; Potassium 4.3; Sodium 141    Lipid Panel    Component Value Date/Time   CHOL 241 (H) 10/17/2010 0848   TRIG 137.0 10/17/2010 0848   HDL 52.10 10/17/2010 0848   CHOLHDL 5 10/17/2010 0848   VLDL 27.4 10/17/2010 0848   LDLCALC 124 (H) 04/20/2008 0839   LDLDIRECT 167.6 10/17/2010 0848      Wt Readings from Last 3 Encounters:  01/16/19 257 lb (116.6 kg)  12/17/18 257 lb 6.4 oz (116.8 kg)  12/09/18 257 lb 6.4 oz (116.8 kg)      Other studies Reviewed: Additional studies/ records that were reviewed today include: Notes from Dr Herma Ard 2014 notes from [place] primary echo reports Notes Dr Roxy Manns for chest tube and hemothorax.    ASSESSMENT AND PLAN:  1. HTN:: poorly controlled increase cozaar to 100 mg daily discussed importance of compliance To minimize growth of aortic root  2. Dilated aortic root:  4.0 cm by CTA done 11/19/18 Discussed importance of BP control to prevent further dilatation of his aorta  3. HLD:  He is still hesitant to start statin Discussed importance given above average  calcium score would like to deal with BP meds only for now  4. Hemothorax: traumatic distant with right 10th rib fracture improved  5. Abnormal ECG:  Chronic ICRBBB observe yearly ECG  6. AS: mild needs f/u echo October 2020 mean gradient 12 mmHg   Current medicines are reviewed at length with the patient today.  The patient does not have concerns regarding medicines.  The following changes have been made:  None   Labs/ tests ordered today include: cardiac None  No orders of the defined types were placed in this encounter.    Disposition:   FU with nurse visit 3 weeks me in 3 months     Signed, Jenkins Rouge, MD  01/16/2019 10:10 AM    [facility] [place], [place], [place]  [address] Phone: [phone]; Fax: [phone]

## 2019-01-16 ENCOUNTER — Ambulatory Visit (INDEPENDENT_AMBULATORY_CARE_PROVIDER_SITE_OTHER): Payer: No Typology Code available for payment source | Admitting: Cardiovascular Disease

## 2019-01-16 ENCOUNTER — Encounter: Payer: Self-pay | Admitting: Cardiovascular Disease

## 2019-01-16 VITALS — BP 172/88 | HR 60 | Ht 70.0 in | Wt 257.0 lb

## 2019-01-16 DIAGNOSIS — I10 Essential (primary) hypertension: Secondary | ICD-10-CM | POA: Diagnosis not present

## 2019-01-16 MED ORDER — LOSARTAN POTASSIUM 100 MG PO TABS: 100.0000 mg | ORAL_TABLET | Freq: Every day | ORAL | 3 refills | Status: DC

## 2019-01-16 NOTE — Patient Instructions (Signed)
Medication Instructions:  Your physician has recommended you make the following change in your medication:  1-Increase Losartan 100 mg by mouth daily.  If you need a refill on your cardiac medications before your next appointment, please call your pharmacy.   Lab work:  If you have labs (blood work) drawn today and your tests are completely normal, you will receive your results only by: Marland Kitchen MyChart Message (if you have MyChart) OR . A paper copy in the mail If you have any lab test that is abnormal or we need to change your treatment, we will call you to review the results.  Testing/Procedures: None ordered today.  Follow-Up: At [facility], you and your health needs are our priority.  As part of our continuing mission to provide you with exceptional heart care, we have created designated Provider Care Teams.  These Care Teams include your primary Cardiologist (physician) and Advanced Practice Providers (APPs -  Physician Assistants and Nurse Practitioners) who all work together to provide you with the care you need, when you need it. Your physician recommends that you schedule a follow-up appointment in: 3 to 4 weeks for hypertension clinic.  You will need a follow up appointment in 3 months.   You may see Jenkins Rouge, MD or one of the following Advanced Practice Providers on your designated Care Team:   Truitt Merle, NP Cecilie Kicks, NP . Kathyrn Drown, NP

## 2019-02-09 ENCOUNTER — Encounter: Payer: Self-pay | Admitting: Pharmacist

## 2019-02-09 ENCOUNTER — Ambulatory Visit (INDEPENDENT_AMBULATORY_CARE_PROVIDER_SITE_OTHER): Payer: No Typology Code available for payment source | Admitting: Pharmacist

## 2019-02-09 VITALS — BP 162/82 | HR 54

## 2019-02-09 DIAGNOSIS — I10 Essential (primary) hypertension: Secondary | ICD-10-CM

## 2019-02-09 LAB — BASIC METABOLIC PANEL
BUN/Creatinine Ratio: 18 (ref 10–24)
BUN: 16 mg/dL (ref 8–27)
CHLORIDE: 105 mmol/L (ref 96–106)
CO2: 25 mmol/L (ref 20–29)
Calcium: 9 mg/dL (ref 8.6–10.2)
Creatinine, Ser: 0.88 mg/dL (ref 0.76–1.27)
GFR calc Af Amer: 99 mL/min/{1.73_m2} (ref >59)
GFR calc non Af Amer: 86 mL/min/{1.73_m2} (ref >59)
Glucose: 84 mg/dL (ref 65–99)
Potassium: 4.4 mmol/L (ref 3.5–5.2)
Sodium: 142 mmol/L (ref 134–144)

## 2019-02-09 MED ORDER — CHLORTHALIDONE 25 MG PO TABS: 25.0000 mg | ORAL_TABLET | Freq: Every day | ORAL | 11 refills | Status: DC

## 2019-02-09 NOTE — Patient Instructions (Signed)
Start taking chlorthalidone 25mg  daily. Continue taking losartan 100mg  daily.  Continue to check your blood pressure at home. Bring your recorded readings with you to your next appointment, along with your blood pressure meter.  Try to start going for daily walks about 30 min at a time.  Call us at [phone] with any questions or concerns

## 2019-02-09 NOTE — Progress Notes (Signed)
Patient ID: Jordan Baldwin                 DOB: 29-Mar-1946                      MRN: [id number]     HPI: DRAXTON LUU is a 73 y.o. male referred by Dr. Johnsie Cancel to HTN clinic. PMH is significant for HTN, aortic root dilalation, hemothorax, kidney stone, PUD, left shoulder replacement and coronary calcium score of 413. Patient last seen by Dr. Johnsie Cancel on 01/16/2019. His blood pressure in clinic that day was 172/88 and his losartan was increased to 100mg  daily. Dr. Johnsie Cancel also spoke to patient about the need for a statin with an elevated coronary calcium score, however patient wished to first focus on his blood pressure and refused a statin at that time.  Patient presents today for a blood pressure check. Patient denies dizziness, lightheadedness, headaches, blurred vision or orthostatics. Patient is using meloxicam  Daily after his shoulder surgery, but plans to finish his last bottle in the next month or so. Patient admits to compliance with his medications. He has not been exercising, only physical therapy, because he is not allowed to pick up more the 5lb currently.   Current HTN meds: losartan 100mg  daily Previously tried:  BP goal: <130/80  Family History: The patient's family history is not on file.   Social History: The patient  reports that he has quit smoking. His smoking use included cigarettes. He has a 0.25 pack-year smoking history. He has never used smokeless tobacco. He reports current alcohol use. He reports that he does not use drugs.   Diet: 1 cup coffee, water, most meals at home- does not add salt often (usually w/ french fries). Wife does cook with salt, does the 56 day meal plan   Exercise: hasn't been able to workout due to shoulder sugery  Home BP readings: 157/80's 135/82 (lowest) HR 69 175/88 HR 53 (today)  Wt Readings from Last 3 Encounters:  01/16/19 257 lb (116.6 kg)  12/17/18 257 lb 6.4 oz (116.8 kg)  12/09/18 257 lb 6.4 oz (116.8 kg)   BP Readings from Last 3  Encounters:  01/16/19 (!) 172/88  12/18/18 (!) 148/82  12/09/18 (!) 155/94   Pulse Readings from Last 3 Encounters:  01/16/19 60  12/18/18 63  12/09/18 62    Renal function: CrCl cannot be calculated (Patient's most recent lab result is older than the maximum 21 days allowed.).  Past Medical History:  Diagnosis Date  . ANEMIA-NOS 11/29/2008   Qualifier: Diagnosis of  By: Linna Darner MD, [facility] 10/18/2010   Qualifier: Diagnosis of  By: Ron Parker, MD, Leonidas Romberg Dorinda Hill   . Arthritis    "hands, feet, shoulders, back" (07/29/2013)  . ARTHROSCOPY, RIGHT KNEE, HX OF 11/29/2008   Qualifier: Diagnosis of  By: Emmaline Kluver CMA, Chrae    . Atelectasis of right lung 07/28/2013  . Basal cell cancer    "neck, right arm" (07/29/2013)  . Fracture of rib of right side 07/28/2013  . Heart murmur   . Hemothorax on right 07/28/2013   Associated with fall 7 days prior to admission and right 10th rib fracture  . High cholesterol   . History of kidney stones   . HYPERLIPIDEMIA 11/29/2008   Qualifier: Diagnosis of  By: Linna Darner MD, Gwyndolyn Saxon    . Hypertension   . IBS 11/29/2008   Qualifier: Diagnosis of  By: Emmaline Kluver CMA,  Chrae    . Kidney stones    "passed them all" (07/29/2013)  . NEOPLASM, SKIN 11/29/2008   Qualifier: Diagnosis of  By: Emmaline Kluver CMA, Chrae    . OVERWEIGHT 10/18/2010   Qualifier: Diagnosis of  By: Ron Parker, MD, Caffie Damme   . PEPTIC ULCER DISEASE 11/29/2008   Qualifier: Diagnosis of  By: Emmaline Kluver CMA, Chrae    . RENAL CALCULUS, HX OF 11/29/2008   Qualifier: Diagnosis of  By: Emmaline Kluver CMA, Chrae    . Shortness of breath    "not before I broke my rib" (07/29/2013)    Current Outpatient Medications on File Prior to Visit  Medication Sig Dispense Refill  . losartan (COZAAR) 100 MG tablet Take 1 tablet (100 mg total) by mouth daily. 90 tablet 3  . meloxicam (MOBIC) 7.5 MG tablet Take 1 tablet (7.5 mg total) by mouth daily. 30 tablet 2  . tamsulosin (FLOMAX) 0.4 MG CAPS  capsule Take 0.4 mg by mouth at bedtime.    . vitamin B-12 (CYANOCOBALAMIN) 100 MCG tablet Take 100 mcg by mouth daily.     No current facility-administered medications on file prior to visit.     No Known Allergies  There were no vitals taken for this visit.   Assessment/Plan:  1. Hypertension - Blood pressure above goal of <130/80. Will add chlorthalidone 25mg  daily. Patient educated on side effects of medication. Will recheck blood pressure in 4 weeks. We also discussed taking daily 30 min walks while he is not able to bike or lift weights. Patient asked to bring his blood pressure meter with him to next appointment to check its accuracy.  2. Hyperlipidemia- Patient not currently on a statin, at increased cardiovascular risk with a coronary calcium score >400. Patient expressed desire to get blood pressure under control before talking about starting a statin with Dr. Tamala Julian. Will plan to discuss this with patient at next appointment.   Thank you  Ramond Dial, Pharm.D, [facility]  [address] [place], [place] [address]  Phone: [phone]; Fax: [phone]

## 2019-03-09 ENCOUNTER — Ambulatory Visit (INDEPENDENT_AMBULATORY_CARE_PROVIDER_SITE_OTHER): Payer: Medicare Other | Admitting: Pharmacist

## 2019-03-09 VITALS — BP 148/86 | HR 54 | Wt 254.0 lb

## 2019-03-09 DIAGNOSIS — I10 Essential (primary) hypertension: Secondary | ICD-10-CM | POA: Diagnosis not present

## 2019-03-09 LAB — BASIC METABOLIC PANEL
BUN/Creatinine Ratio: 17 (ref 10–24)
BUN: 19 mg/dL (ref 8–27)
CO2: 25 mmol/L (ref 20–29)
CREATININE: 1.15 mg/dL (ref 0.76–1.27)
Calcium: 9.5 mg/dL (ref 8.6–10.2)
Chloride: 104 mmol/L (ref 96–106)
GFR calc Af Amer: 73 mL/min/{1.73_m2} (ref >59)
GFR calc non Af Amer: 63 mL/min/{1.73_m2} (ref >59)
Glucose: 88 mg/dL (ref 65–99)
Potassium: 3.9 mmol/L (ref 3.5–5.2)
Sodium: 144 mmol/L (ref 134–144)

## 2019-03-09 NOTE — Patient Instructions (Addendum)
It was nice to meet you today.  Start checking your blood pressure later in the day (at least two hours after you take your morning medicines).  Continue to increase your activity levels to help your blood pressure decrease further. If you can, stop taking your meloxicam as this can also be contributing to higher blood pressure.

## 2019-03-09 NOTE — Progress Notes (Signed)
Patient ID: Jordan Baldwin                 DOB: 1946-02-23                      MRN: [id number]     HPI: Jordan Baldwin is a 73 y.o. male referred by Dr. Johnsie Cancel to HTN clinic. PMH is significant for HTN, aortic root dilalation, hemothorax, kidney stone, PUD, left shoulder replacement and coronary calcium score of 413. At last visit with Dr. Johnsie Cancel on 01/16/2019, he spoke to patient about the need for a statin with an elevated coronary calcium score (10/2018); 444, 66th percentile), however patient wished to first focus on his blood pressure and refused a statin at that time. Patient last seen in HTN clinic on 02/09/2019. At that visit his BP was 162/82. Chlorthalidone 25mg  daily was added.  Patient presents today for a blood pressure check. He reports feeling better (less wiped out). Drank coffee 3 hours ago. He is still taking meloxicam at home. His home blood pressure are more controlled than previously, averaging 130s/80s. He brought in his home blood pressure cuff which correlated well with clinic reading. He denies dizziness, lightheadedness, headaches, blurred vision or orthostatics. In regards to lipids, he reports previously taking rosuvastatin (5 mg daily). He reports he experienced generalized achiness and just "never felt good" and was stopped on his medication.    Current HTN meds: losartan 100mg  daily (AM), chlorthalidone 25mg  daily (AM)  Previously tried:  BP goal: <130/80  Family History: Father (died of MI, 18), Mother (died at 51, stroke, HTN, HLD)  Social History: The patient  reports that he has quit smoking. His smoking use included cigarettes. He has a 0.25 pack-year smoking history. He has never used smokeless tobacco. He reports current alcohol use. He reports that he does not use drugs.   Diet: 1 cup coffee, water, most meals at home- does not add salt often (usually w/ french fries). Wife does cook with salt, does the 56 day meal plan   Exercise: hasn't been able to workout  due to shoulder surgery; sees physical therapist twice a week (stretching, weight lifting). Is waiting for clearance from orthopedic doctor before increasing activity.  Home BP readings: Averaging: 130s/80s; Highest 150/82; Lowest 119/82; checks blood pressure before taking morning medicines. Verified cuff against clinic cuff, 158/88 then 148/86 on recheck, correlated well with reading.  Previously: 157/80's; 135/82 (lowest)   Labs: 09/2018: Tot 215, LDL 127, HDL 57, TG 162  Wt Readings from Last 3 Encounters:  01/16/19 257 lb (116.6 kg)  12/17/18 257 lb 6.4 oz (116.8 kg)  12/09/18 257 lb 6.4 oz (116.8 kg)   BP Readings from Last 3 Encounters:  02/09/19 (!) 162/82  01/16/19 (!) 172/88  12/18/18 (!) 148/82   Pulse Readings from Last 3 Encounters:  02/09/19 (!) 54  01/16/19 60  12/18/18 63    Renal function: CrCl cannot be calculated (Patient's most recent lab result is older than the maximum 21 days allowed.).  Past Medical History:  Diagnosis Date  . ANEMIA-NOS 11/29/2008   Qualifier: Diagnosis of  By: Linna Darner MD, [facility] 10/18/2010   Qualifier: Diagnosis of  By: Ron Parker, MD, Leonidas Romberg Dorinda Hill   . Arthritis    "hands, feet, shoulders, back" (07/29/2013)  . ARTHROSCOPY, RIGHT KNEE, HX OF 11/29/2008   Qualifier: Diagnosis of  By: Emmaline Kluver CMA, Chrae    . Atelectasis of right lung  07/28/2013  . Basal cell cancer    "neck, right arm" (07/29/2013)  . Fracture of rib of right side 07/28/2013  . Heart murmur   . Hemothorax on right 07/28/2013   Associated with fall 7 days prior to admission and right 10th rib fracture  . High cholesterol   . History of kidney stones   . HYPERLIPIDEMIA 11/29/2008   Qualifier: Diagnosis of  By: Linna Darner MD, Gwyndolyn Saxon    . Hypertension   . IBS 11/29/2008   Qualifier: Diagnosis of  By: Emmaline Kluver CMA, Chrae    . Kidney stones    "passed them all" (07/29/2013)  . NEOPLASM, SKIN 11/29/2008   Qualifier: Diagnosis of  By: Emmaline Kluver CMA,  Chrae    . OVERWEIGHT 10/18/2010   Qualifier: Diagnosis of  By: Ron Parker, MD, Caffie Damme   . PEPTIC ULCER DISEASE 11/29/2008   Qualifier: Diagnosis of  By: Emmaline Kluver CMA, Chrae    . RENAL CALCULUS, HX OF 11/29/2008   Qualifier: Diagnosis of  By: Emmaline Kluver CMA, Chrae    . Shortness of breath    "not before I broke my rib" (07/29/2013)    Current Outpatient Medications on File Prior to Visit  Medication Sig Dispense Refill  . chlorthalidone (HYGROTON) 25 MG tablet Take 1 tablet (25 mg total) by mouth daily. 30 tablet 11  . losartan (COZAAR) 100 MG tablet Take 1 tablet (100 mg total) by mouth daily. 90 tablet 3  . meloxicam (MOBIC) 7.5 MG tablet Take 1 tablet (7.5 mg total) by mouth daily. 30 tablet 2  . tamsulosin (FLOMAX) 0.4 MG CAPS capsule Take 0.4 mg by mouth at bedtime.    . vitamin B-12 (CYANOCOBALAMIN) 100 MCG tablet Take 100 mcg by mouth daily.     No current facility-administered medications on file prior to visit.     No Known Allergies  There were no vitals taken for this visit.   Assessment/Plan:  1. Hypertension - Blood pressure above goal of < 130/80 in clinic on chlorthalidone 25 mg daily and losartan 100 mg daily. Home blood pressures showing significant more control than previously, averaging 130s/80s with 1-2 readings 140-150s. He is checking his blood pressure prior to taking his medications. He continues taking meloxicam. Encouraged patient to stop taking meloxicam and start checking blood pressure at least 2 hours after taking his morning medications. Encouraged him to also increase activity not requiring his shoulder (I.e. sitting bike) until he is released from his shoulder restrictions. BMET today, to check renal function/lyte after starting chlorthalidone. Will call patient in ~2-3 weeks to check his home blood pressure, follow up in HTN clinic prn.   2. Hyperlipidemia- Patient not currently on a statin, at increased cardiovascular risk with a coronary calcium  score >400. Patient expressed desire to continue with lifestyle modifications to better control cholesterol. He has labs with his PCP at the [facility] in August of this year. Encouraged patient to continue with lifestyle modifications.    Thank you  Claiborne Billings, PharmD PGY2 Cardiology Pharmacy Resident 03/09/2019 11:38 AM

## 2019-03-09 NOTE — Progress Notes (Deleted)
Patient ID: ALANI LACIVITA                 DOB: 1946-10-20                      MRN: [id number]     HPI: Jordan Baldwin is a 73 y.o. male referred by Dr. Johnsie Cancel to HTN clinic. PMH is significant for HTN, aortic root dilalation, hemothorax, kidney stone, PUD, left shoulder replacement and coronary calcium score of 413. Patient last seen in HTN on 02/09/2019. At that visit his BP was 162/82. Chlorthalidone 25mg  daily was added. At last visit with Dr. Johnsie Cancel on 01/16/2019, he spoke to patient about the need for a statin with an elevated coronary calcium score, however patient wished to first focus on his blood pressure and refused a statin at that time.  Patient presents today for a blood pressure check.  dizziness, lightheadedness, headaches, blurred vision or orthostatics.  Meloxicam? Compliance? Exercise? Needs BMP statin Add amlodipine  Current HTN meds: losartan 100mg  daily, chlorthalidone 25mg  daily Previously tried:  BP goal: <130/80  Family History: The patient's family history is not on file.   Social History: The patient  reports that he has quit smoking. His smoking use included cigarettes. He has a 0.25 pack-year smoking history. He has never used smokeless tobacco. He reports current alcohol use. He reports that he does not use drugs.   Diet: 1 cup coffee, water, most meals at home- does not add salt often (usually w/ french fries). Wife does cook with salt, does the 56 day meal plan   Exercise: hasn't been able to workout due to shoulder sugery  Home BP readings: 157/80's 135/82 (lowest) HR 69 175/88 HR 53 (today)  Wt Readings from Last 3 Encounters:  01/16/19 257 lb (116.6 kg)  12/17/18 257 lb 6.4 oz (116.8 kg)  12/09/18 257 lb 6.4 oz (116.8 kg)   BP Readings from Last 3 Encounters:  02/09/19 (!) 162/82  01/16/19 (!) 172/88  12/18/18 (!) 148/82   Pulse Readings from Last 3 Encounters:  02/09/19 (!) 54  01/16/19 60  12/18/18 63    Renal function: CrCl cannot be  calculated (Patient's most recent lab result is older than the maximum 21 days allowed.).  Past Medical History:  Diagnosis Date  . ANEMIA-NOS 11/29/2008   Qualifier: Diagnosis of  By: Linna Darner MD, [facility] 10/18/2010   Qualifier: Diagnosis of  By: Ron Parker, MD, Leonidas Romberg Dorinda Hill   . Arthritis    "hands, feet, shoulders, back" (07/29/2013)  . ARTHROSCOPY, RIGHT KNEE, HX OF 11/29/2008   Qualifier: Diagnosis of  By: Emmaline Kluver CMA, Chrae    . Atelectasis of right lung 07/28/2013  . Basal cell cancer    "neck, right arm" (07/29/2013)  . Fracture of rib of right side 07/28/2013  . Heart murmur   . Hemothorax on right 07/28/2013   Associated with fall 7 days prior to admission and right 10th rib fracture  . High cholesterol   . History of kidney stones   . HYPERLIPIDEMIA 11/29/2008   Qualifier: Diagnosis of  By: Linna Darner MD, Gwyndolyn Saxon    . Hypertension   . IBS 11/29/2008   Qualifier: Diagnosis of  By: Emmaline Kluver CMA, Chrae    . Kidney stones    "passed them all" (07/29/2013)  . NEOPLASM, SKIN 11/29/2008   Qualifier: Diagnosis of  By: Emmaline Kluver CMA, Chrae    . OVERWEIGHT 10/18/2010   Qualifier: Diagnosis  of  By: Ron Parker, MD, Leonidas Romberg Dorinda Hill   . PEPTIC ULCER DISEASE 11/29/2008   Qualifier: Diagnosis of  By: Emmaline Kluver CMA, Chrae    . RENAL CALCULUS, HX OF 11/29/2008   Qualifier: Diagnosis of  By: Emmaline Kluver CMA, Chrae    . Shortness of breath    "not before I broke my rib" (07/29/2013)    Current Outpatient Medications on File Prior to Visit  Medication Sig Dispense Refill  . chlorthalidone (HYGROTON) 25 MG tablet Take 1 tablet (25 mg total) by mouth daily. 30 tablet 11  . losartan (COZAAR) 100 MG tablet Take 1 tablet (100 mg total) by mouth daily. 90 tablet 3  . meloxicam (MOBIC) 7.5 MG tablet Take 1 tablet (7.5 mg total) by mouth daily. 30 tablet 2  . tamsulosin (FLOMAX) 0.4 MG CAPS capsule Take 0.4 mg by mouth at bedtime.    . vitamin B-12 (CYANOCOBALAMIN) 100 MCG tablet Take 100  mcg by mouth daily.     No current facility-administered medications on file prior to visit.     No Known Allergies  There were no vitals taken for this visit.   Assessment/Plan:  1. Hypertension -   2. Hyperlipidemia- Patient not currently on a statin, at increased cardiovascular risk with a coronary calcium score >400. Patient expressed desire to get blood pressure under control before talking about starting a statin with Dr. Johnsie Cancel. Will plan to discuss this with patient at next appointment.   Thank you  Ramond Dial, Pharm.D, [facility]  [address], [place], [place] [address]  Phone: [phone]; Fax: [phone]

## 2019-03-10 ENCOUNTER — Telehealth: Payer: Self-pay | Admitting: Pharmacist

## 2019-03-10 DIAGNOSIS — I10 Essential (primary) hypertension: Secondary | ICD-10-CM

## 2019-03-10 NOTE — Telephone Encounter (Signed)
Spoke with patient about results from BMET. Will repeat BMET in 2 weeks since mild increase in Scr. Pt states understanding.

## 2019-03-10 NOTE — Telephone Encounter (Signed)
BP yesterday and today were 127/74 HR 62 and 126/71 HR 58.

## 2019-03-23 ENCOUNTER — Other Ambulatory Visit: Payer: Non-veteran care

## 2019-04-10 ENCOUNTER — Telehealth: Payer: Self-pay

## 2019-04-10 NOTE — Telephone Encounter (Signed)
YOUR CARDIOLOGY TEAM HAS ARRANGED FOR AN E-VISIT FOR YOUR APPOINTMENT - PLEASE REVIEW IMPORTANT INFORMATION BELOW SEVERAL DAYS PRIOR TO YOUR APPOINTMENT  Due to the recent COVID-19 pandemic, we are transitioning in-person office visits to tele-medicine visits in an effort to decrease unnecessary exposure to our patients and staff. Medicare and most insurances are covering these visits without a copay needed. We also encourage you to sign up for MyChart if you have not already done so. You will need a smartphone if possible. For patients that do not have this, we can still complete the visit using a regular telephone but do prefer a smartphone to enable video when possible. You may have a close family member that lives with you that can help. If possible, we also ask that you have a blood pressure cuff and scale at home to measure your blood pressure, heart rate and weight prior to your scheduled appointment. Patients with clinical needs that need an in-person evaluation and testing will still be able to come to the office if absolutely necessary. If you have any questions, feel free to call our office.    2-3 DAYS BEFORE YOUR APPOINTMENT  We will remind you check your blood pressure, heart rate and weight prior to your scheduled appointment. If you have an Apple Watch or Kardia, please upload any pertinent ECG strips the day before or morning of your appointment to [facility]. Our staff will also make sure you have reviewed the consent and agree to move forward with your scheduled tele-health visit.     THE DAY OF YOUR APPOINTMENT  Approximately 15 minutes prior to your scheduled appointment, you will receive a telephone call from one of [facility] team - your caller ID may say "Unknown caller."  Our staff will confirm medications, vital signs for the day and any symptoms you may be experiencing. Please have this information available prior to the time of visit start. It may also be helpful for you to have  a pad of paper and pen handy for any instructions given during your visit. They will also walk you through joining the WebEx smartphone meeting if this is a video visit.    CONSENT FOR TELE-HEALTH VISIT - PLEASE REVIEW  I hereby voluntarily request, consent and authorize [facility] and its employed or contracted physicians, physician assistants, nurse practitioners or other licensed health care professionals (the Practitioner), to provide me with telemedicine health care services (the "Services") as deemed necessary by the treating Practitioner. I acknowledge and consent to receive the Services by the Practitioner via telemedicine. I understand that the telemedicine visit will involve communicating with the Practitioner through live audiovisual communication technology and the disclosure of certain medical information by electronic transmission. I acknowledge that I have been given the opportunity to request an in-person assessment or other available alternative prior to the telemedicine visit and am voluntarily participating in the telemedicine visit.  I understand that I have the right to withhold or withdraw my consent to the use of telemedicine in the course of my care at any time, without affecting my right to future care or treatment, and that the Practitioner or I may terminate the telemedicine visit at any time. I understand that I have the right to inspect all information obtained and/or recorded in the course of the telemedicine visit and may receive copies of available information for a reasonable fee.  I understand that some of the potential risks of receiving the Services via telemedicine include:  Marland Kitchen Delay or interruption  in medical evaluation due to technological equipment failure or disruption; . Information transmitted may not be sufficient (e.g. poor resolution of images) to allow for appropriate medical decision making by the Practitioner; and/or  . In rare instances, security  protocols could fail, causing a breach of personal health information.  Furthermore, I acknowledge that it is my responsibility to provide information about my medical history, conditions and care that is complete and accurate to the best of my ability. I acknowledge that Practitioner's advice, recommendations, and/or decision may be based on factors not within their control, such as incomplete or inaccurate data provided by me or distortions of diagnostic images or specimens that may result from electronic transmissions. I understand that the practice of medicine is not an exact science and that Practitioner makes no warranties or guarantees regarding treatment outcomes. I acknowledge that I will receive a copy of this consent concurrently upon execution via email to the email address I last provided but may also request a printed copy by calling the office of [facility].    I understand that my insurance will be billed for this visit.   I have read or had this consent read to me. . I understand the contents of this consent, which adequately explains the benefits and risks of the Services being provided via telemedicine.  . I have been provided ample opportunity to ask questions regarding this consent and the Services and have had my questions answered to my satisfaction. . I give my informed consent for the services to be provided through the use of telemedicine in my medical care  By participating in this telemedicine visit I agree to the above.

## 2019-04-13 ENCOUNTER — Ambulatory Visit: Payer: No Typology Code available for payment source

## 2019-04-15 NOTE — Progress Notes (Signed)
Virtual Visit via Video Note   This visit type was conducted due to national recommendations for restrictions regarding the COVID-19 Pandemic (e.g. social distancing) in an effort to limit this patient's exposure and mitigate transmission in our community.  Due to his co-morbid illnesses, this patient is at least at moderate risk for complications without adequate follow up.  This format is felt to be most appropriate for this patient at this time.  All issues noted in this document were discussed and addressed.  A limited physical exam was performed with this format.  Please refer to the patient's chart for his consent to telehealth for [facility].   Evaluation Performed:  Follow-up visit  Date:  04/17/2019   ID:  Jordan Baldwin, DOB Jun 02, 1946, MRN [id number]  Patient Location: Home Provider Location: Office  PCP:  [facility]  Cardiologist:  Jenkins Rouge, MD   Electrophysiologist:  None   Chief Complaint:  HTN  History of Present Illness:    Jordan Baldwin is a 73 y.o. male seen f/u HTN, aortic root dilatation Distant History of traumatic righ lung hemothorax with chest tube. BP improved on ARB. CTA done . 11/20/219 moderate CAD calcium score 413 66 th percentile  Aortic root 4.0 cm He has been hesitant to start statin despite above average  Calcium score. History of murmur TTE reviewed from 10/27/18 mild AS mean gradient 12 mmHg peak 18 mmHg Aortic root was 4.1 cm   Had left total shoulder replacement with Dr Joanie Coddington   No recurrent kidney stones   BP been running high at home  Cozaar increased to 100 mg 01/16/19 Long discussion about statin to decrease progression of CAD/AS Willing to try   The patient does not have symptoms concerning for COVID-19 infection (fever, chills, cough, or new shortness of breath).    Past Medical History:  Diagnosis Date  . ANEMIA-NOS 11/29/2008   Qualifier: Diagnosis of  By: Linna Darner MD, [place] [facility]  10/18/2010   Qualifier: Diagnosis of  By: Ron Parker, MD, Leonidas Romberg Dorinda Hill   . Arthritis    "hands, feet, shoulders, back" (07/29/2013)  . ARTHROSCOPY, RIGHT KNEE, HX OF 11/29/2008   Qualifier: Diagnosis of  By: Emmaline Kluver CMA, [place]    . Atelectasis of right lung 07/28/2013  . Basal cell cancer    "neck, right arm" (07/29/2013)  . Fracture of rib of right side 07/28/2013  . Heart murmur   . Hemothorax on right 07/28/2013   Associated with fall 7 days prior to admission and right 10th rib fracture  . High cholesterol   . History of kidney stones   . HYPERLIPIDEMIA 11/29/2008   Qualifier: Diagnosis of  By: Linna Darner MD, Gwyndolyn Saxon    . Hypertension   . IBS 11/29/2008   Qualifier: Diagnosis of  By: Emmaline Kluver CMA, [place]    . Kidney stones    "passed them all" (07/29/2013)  . NEOPLASM, SKIN 11/29/2008   Qualifier: Diagnosis of  By: Emmaline Kluver CMA, [place]    . OVERWEIGHT 10/18/2010   Qualifier: Diagnosis of  By: Ron Parker, MD, Caffie Damme   . PEPTIC ULCER DISEASE 11/29/2008   Qualifier: Diagnosis of  By: Emmaline Kluver CMA, Ch[place]    . RENAL CALCULUS, HX OF 11/29/2008   Qualifier: Diagnosis of  By: Emmaline Kluver CMA, Chrae    . Shortness of breath    "not before I broke my rib" (07/29/2013)   Past Surgical History:  Procedure Laterality Date  . ADENOIDECTOMY    .  CHEST TUBE INSERTION Right 07/28/2013  . CHOLECYSTECTOMY    . COLONOSCOPY WITH ESOPHAGOGASTRODUODENOSCOPY (EGD)    . ELBOW SURGERY Right    "2 ORs:  lateral detachment of the muscle; clean out bone chips and calcium deposits" (07/29/2013)  . KNEE ARTHROSCOPY Right   . TONSILLECTOMY AND ADENOIDECTOMY    . TOTAL SHOULDER ARTHROPLASTY Left 12/17/2018  . TOTAL SHOULDER ARTHROPLASTY Left 12/17/2018   Procedure: TOTAL SHOULDER ARTHROPLASTY;  Surgeon: Hiram Gash, MD;  Location: [facility];  Service: Orthopedics;  Laterality: Left;  Marland Kitchen VIDEO ASSISTED THORACOSCOPY (VATS)/THOROCOTOMY Right 07/30/2013   Procedure: VIDEO ASSISTED THORACOSCOPY (VATS)/THOROCOTOMY;  Surgeon:  Rexene Alberts, MD;  Location: [place];  Service: Thoracic;  Laterality: Right;  evacuation of hemothorax     Current Meds  Medication Sig  . chlorthalidone (HYGROTON) 25 MG tablet Take 1 tablet (25 mg total) by mouth daily.  Marland Kitchen losartan (COZAAR) 100 MG tablet Take 1 tablet (100 mg total) by mouth daily.  . vitamin B-12 (CYANOCOBALAMIN) 100 MCG tablet Take 100 mcg by mouth daily.     Allergies:   Patient has no known allergies.   Social History   Tobacco Use  . Smoking status: Former Smoker    Packs/day: 0.25    Years: 1.00    Pack years: 0.25    Types: Cigarettes  . Smokeless tobacco: Never Used  . Tobacco comment: 07/29/2013 "smoked cigarettes less than 1 yr; I sold them; don't remember when I quit"  Substance Use Topics  . Alcohol use: Yes    Comment: very rarely  . Drug use: No     Family Hx: The patient's family history is not on file.  ROS:   Please see the history of present illness.     All other systems reviewed and are negative.   Prior CV studies:   The following studies were reviewed today:  Echo 10/27/18  Labs/Other Tests and Data Reviewed:    EKG: SR ICRBBB RBBB LVH  Recent Labs: 12/09/2018: Hemoglobin 14.4; Platelets 249 03/09/2019: BUN 19; Creatinine, Ser 1.15; Potassium 3.9; Sodium 144   Recent Lipid Panel Lab Results  Component Value Date/Time   CHOL 241 (H) 10/17/2010 08:48 AM   TRIG 137.0 10/17/2010 08:48 AM   HDL 52.10 10/17/2010 08:48 AM   CHOLHDL 5 10/17/2010 08:48 AM   LDLCALC 124 (H) 04/20/2008 08:39 AM   LDLDIRECT 167.6 10/17/2010 08:48 AM    Wt Readings from Last 3 Encounters:  04/17/19 115.4 kg  03/09/19 115.2 kg  01/16/19 116.6 kg     Objective:    Vital Signs:  BP 133/77   Pulse (!) 56   Ht 5\' 10"  (1.778 m)   Wt 115.4 kg   BMI 36.50 kg/m    Well nourished, well developed male in no acute distress. Skin warm and dry No JVP elevation No tachypnea No edema  ASSESSMENT & PLAN:    1. HTN:: ARB increased January  2020 much better  2. Dilated aortic root:  4.0 cm by CTA done 11/19/18 Discussed importance of BP control to prevent further dilatation of his aorta  3. HLD:  convinced him to try statin call in crestor 5 mg to take at night repeat labs in 3 months if tolerates had had flu like symptoms with lipitor in past  4. Hemothorax: traumatic distant with right 10th rib fracture improved  5. Abnormal ECG:  Chronic ICRBBB observe yearly ECG  6. AS: mild needs f/u echo October 2020 mean gradient 12 mmHg  COVID-19 Education: The signs and symptoms of COVID-19 were discussed with the patient and how to seek care for testing (follow up with PCP or arrange E-visit).  The importance of social distancing was discussed today.  Time:   Today, I have spent 30 minutes with the patient with telehealth technology discussing the above problems.     Medication Adjustments/Labs and Tests Ordered: Current medicines are reviewed at length with the patient today.  Concerns regarding medicines are outlined above.   Tests Ordered: Lipid and liver Echo 10/2019  Medication Changes: Crestor 5 mg daily at night   Disposition:  Follow up  October with echo same day AS   Signed, Jenkins Rouge, MD  04/17/2019 10:29 AM    [place]

## 2019-04-17 ENCOUNTER — Telehealth (INDEPENDENT_AMBULATORY_CARE_PROVIDER_SITE_OTHER): Payer: Medicare Other | Admitting: Cardiovascular Disease

## 2019-04-17 ENCOUNTER — Other Ambulatory Visit: Payer: Self-pay

## 2019-04-17 ENCOUNTER — Encounter: Payer: Self-pay | Admitting: Cardiovascular Disease

## 2019-04-17 VITALS — BP 133/77 | HR 56 | Ht 70.0 in | Wt 254.4 lb

## 2019-04-17 DIAGNOSIS — I350 Nonrheumatic aortic (valve) stenosis: Secondary | ICD-10-CM | POA: Diagnosis not present

## 2019-04-17 DIAGNOSIS — E782 Mixed hyperlipidemia: Secondary | ICD-10-CM

## 2019-04-17 MED ORDER — ROSUVASTATIN CALCIUM 5 MG PO TABS: 5.0000 mg | ORAL_TABLET | Freq: Every day | ORAL | 3 refills | Status: DC

## 2019-04-17 NOTE — Patient Instructions (Addendum)
Medication Instructions:  Your physician has recommended you make the following change in your medication:  1-START Crestor 5 mg by mouth daily.  If you need a refill on your cardiac medications before your next appointment, please call your pharmacy.   Lab work: Your physician recommends that you return for lab work in: 4 months for fasting lipid and liver panel  If you have labs (blood work) drawn today and your tests are completely normal, you will receive your results only by: Marland Kitchen MyChart Message (if you have MyChart) OR . A paper copy in the mail If you have any lab test that is abnormal or we need to change your treatment, we will call you to review the results.  Testing/Procedures: Your physician has requested that you have an echocardiogram in October. Echocardiography is a painless test that uses sound waves to create images of your heart. It provides your doctor with information about the size and shape of your heart and how well your heart's chambers and valves are working. This procedure takes approximately one hour. There are no restrictions for this procedure.  Follow-Up: At [facility], you and your health needs are our priority.  As part of our continuing mission to provide you with exceptional heart care, we have created designated Provider Care Teams.  These Care Teams include your primary Cardiologist (physician) and Advanced Practice Providers (APPs -  Physician Assistants and Nurse Practitioners) who all work together to provide you with the care you need, when you need it. You will need a follow up appointment in October.  Please call our office 2 months in advance to schedule this appointment.  You may see Jenkins Rouge, MD or one of the following Advanced Practice Providers on your designated Care Team:   Truitt Merle, NP Cecilie Kicks, NP . Kathyrn Drown, NP   Rosuvastatin Tablets What is this medicine? ROSUVASTATIN (roe SOO va sta tin) is known as a HMG-CoA  reductase inhibitor or 'statin'. It lowers cholesterol and triglycerides in the blood. This drug may also reduce the risk of heart attack, stroke, or other health problems in patients with risk factors for heart disease. Diet and lifestyle changes are often used with this drug. This medicine may be used for other purposes; ask your health care provider or pharmacist if you have questions. COMMON BRAND NAME(S): Crestor What should I tell my health care provider before I take this medicine? They need to know if you have any of these conditions: -diabetes -if you often drink alcohol -history of stroke -kidney disease -liver disease -muscle aches or weakness -thyroid disease -an unusual or allergic reaction to rosuvastatin, other medicines, foods, dyes, or preservatives -pregnant or trying to get pregnant -breast-feeding How should I use this medicine? Take this medicine by mouth with a glass of water. Follow the directions on the prescription label. Do not cut, crush or chew this medicine. You can take this medicine with or without food. Take your doses at regular intervals. Do not take your medicine more often than directed. Talk to your pediatrician regarding the use of this medicine in children. While this drug may be prescribed for children as young as 34 years old for selected conditions, precautions do apply. Overdosage: If you think you have taken too much of this medicine contact a poison control center or emergency room at once. NOTE: This medicine is only for you. Do not share this medicine with others. What if I miss a dose? If you miss a dose, take  it as soon as you can. If your next dose is to be taken in less than 12 hours, then do not take the missed dose. Take the next dose at your regular time. Do not take double or extra doses. What may interact with this medicine? Do not take this medicine with any of the following medications: -herbal medicines like red yeast rice This  medicine may also interact with the following medications: -alcohol -antacids containing aluminum hydroxide or magnesium hydroxide -cyclosporine -other medicines for high cholesterol -some medicines for HIV infection -warfarin This list may not describe all possible interactions. Give your health care provider a list of all the medicines, herbs, non-prescription drugs, or dietary supplements you use. Also tell them if you smoke, drink alcohol, or use illegal drugs. Some items may interact with your medicine. What should I watch for while using this medicine? Visit your doctor or health care professional for regular check-ups. You may need regular tests to make sure your liver is working properly. Your health care professional may tell you to stop taking this medicine if you develop muscle problems. If your muscle problems do not go away after stopping this medicine, contact your health care professional. Do not become pregnant while taking this medicine. Women should inform their health care professional if they wish to become pregnant or think they might be pregnant. There is a potential for serious side effects to an unborn child. Talk to your health care professional or pharmacist for more information. Do not breast-feed an infant while taking this medicine. This medicine may affect blood sugar levels. If you have diabetes, check with your doctor or health care professional before you change your diet or the dose of your diabetic medicine. If you are going to need surgery or other procedure, tell your doctor that you are using this medicine. This drug is only part of a total heart-health program. Your doctor or a dietician can suggest a low-cholesterol and low-fat diet to help. Avoid alcohol and smoking, and keep a proper exercise schedule. This medicine may cause a decrease in Co-Enzyme Q-10. You should make sure that you get enough Co-Enzyme Q-10 while you are taking this medicine. Discuss the  foods you eat and the vitamins you take with your health care professional. What side effects may I notice from receiving this medicine? Side effects that you should report to your doctor or health care professional as soon as possible: -allergic reactions like skin rash, itching or hives, swelling of the face, lips, or tongue -dark urine -fever -joint pain -muscle cramps, pain -redness, blistering, peeling or loosening of the skin, including inside the mouth -trouble passing urine or change in the amount of urine -unusually weak or tired -yellowing of the eyes or skin Side effects that usually do not require medical attention (report to your doctor or health care professional if they continue or are bothersome): -constipation -heartburn -nausea -stomach gas, pain, upset This list may not describe all possible side effects. Call your doctor for medical advice about side effects. You may report side effects to FDA at 1-800-FDA-1088. Where should I keep my medicine? Keep out of the reach of children. Store at room temperature between 20 and 25 degrees C (68 and 77 degrees F). Keep container tightly closed (protect from moisture). Throw away any unused medicine after the expiration date. NOTE: This sheet is a summary. It may not cover all possible information. If you have questions about this medicine, talk to your doctor, pharmacist, or health  care provider.  2019 Elsevier/Gold Standard (2017-08-20 12:42:43)

## 2019-04-23 ENCOUNTER — Telehealth: Payer: Self-pay | Admitting: Cardiovascular Disease

## 2019-04-23 DIAGNOSIS — Z79899 Other long term (current) drug therapy: Secondary | ICD-10-CM

## 2019-04-23 DIAGNOSIS — E785 Hyperlipidemia, unspecified: Secondary | ICD-10-CM

## 2019-04-23 DIAGNOSIS — I10 Essential (primary) hypertension: Secondary | ICD-10-CM

## 2019-04-23 NOTE — Telephone Encounter (Signed)
Called patient with Dr. Kyla Balzarine recommendations. Patient verbalized understanding and lab work appointment scheduled for June.

## 2019-04-23 NOTE — Telephone Encounter (Signed)
BMET and May is for mild increase in Scr per [facility]. Will forward to Dr. Johnsie Cancel and Georgina Peer for further advisement about pushing BMET out and side effects of Crestor.

## 2019-04-23 NOTE — Telephone Encounter (Signed)
OK to move BMET with labs in June.

## 2019-04-23 NOTE — Telephone Encounter (Signed)
New message     Pt c/o medication issue:  1. Name of Medication: generic crestor  2. How are you currently taking this medication (dosage and times per day)? 5mg  at night around 10:30-11pm  3. Are you having a reaction (difficulty breathing--STAT)? no  4. What is your medication issue? Pt states that he takes medication before bedtime.  Since starting medication, he has noticed that when he turns over in bed, he is dizzy. He is not dizzy anytime during daytime hours.  Could this be caused by the medication? Also, I scheduled pt a lab appt in July for lipid and liver.  He already has a may lab appt scheduled for something else.  Can he wait until July to have may lab drawn?  Please advise.

## 2019-04-23 NOTE — Telephone Encounter (Signed)
Crestor should not be causing dizziness. Can have lipid / comprehensive metabolic in 8 weeks

## 2019-05-26 ENCOUNTER — Other Ambulatory Visit: Payer: Non-veteran care

## 2019-06-22 ENCOUNTER — Telehealth: Payer: Self-pay | Admitting: *Deleted

## 2019-06-22 NOTE — Telephone Encounter (Signed)
    COVID-19 Pre-Screening Questions:  . In the past 7 to 10 days have you had a cough,  shortness of breath, headache, congestion, fever (100 or greater) body aches, chills, sore throat, or sudden loss of taste or sense of smell? . Have you been around anyone with known Covid 19. . Have you been around anyone who is awaiting Covid 19 test results in the past 7 to 10 days? . Have you been around anyone who has been exposed to Covid 19, or has mentioned symptoms of Covid 19 within the past 7 to 10 days?  If you have any concerns/questions about symptoms patients report during screening (either on the phone or at threshold). Contact the provider seeing the patient or DOD for further guidance.  If neither are available contact a member of the leadership team.           Contacted patient via telephone call. NO to all Covid 19 questions and has a mask. KB

## 2019-06-24 ENCOUNTER — Other Ambulatory Visit: Payer: Self-pay

## 2019-06-24 ENCOUNTER — Other Ambulatory Visit: Payer: Non-veteran care | Admitting: *Deleted

## 2019-06-24 DIAGNOSIS — I350 Nonrheumatic aortic (valve) stenosis: Secondary | ICD-10-CM

## 2019-06-24 DIAGNOSIS — E782 Mixed hyperlipidemia: Secondary | ICD-10-CM

## 2019-06-24 DIAGNOSIS — E785 Hyperlipidemia, unspecified: Secondary | ICD-10-CM

## 2019-06-24 DIAGNOSIS — Z79899 Other long term (current) drug therapy: Secondary | ICD-10-CM

## 2019-06-24 DIAGNOSIS — I10 Essential (primary) hypertension: Secondary | ICD-10-CM

## 2019-06-24 LAB — COMPREHENSIVE METABOLIC PANEL
ALT: 16 IU/L (ref 0–44)
AST: 17 IU/L (ref 0–40)
Albumin/Globulin Ratio: 2.1 (ref 1.2–2.2)
Albumin: 4.4 g/dL (ref 3.7–4.7)
Alkaline Phosphatase: 77 IU/L (ref 39–117)
BUN/Creatinine Ratio: 14 (ref 10–24)
BUN: 18 mg/dL (ref 8–27)
Bilirubin Total: 0.7 mg/dL (ref 0.0–1.2)
CO2: 25 mmol/L (ref 20–29)
Calcium: 9.3 mg/dL (ref 8.6–10.2)
Chloride: 101 mmol/L (ref 96–106)
Creatinine, Ser: 1.26 mg/dL (ref 0.76–1.27)
GFR calc Af Amer: 65 mL/min/{1.73_m2} (ref >59)
GFR calc non Af Amer: 56 mL/min/{1.73_m2} — ABNORMAL LOW (ref >59)
Globulin, Total: 2.1 g/dL (ref 1.5–4.5)
Glucose: 100 mg/dL — ABNORMAL HIGH (ref 65–99)
Potassium: 3.8 mmol/L (ref 3.5–5.2)
Sodium: 141 mmol/L (ref 134–144)
Total Protein: 6.5 g/dL (ref 6.0–8.5)

## 2019-06-24 LAB — LIPID PANEL
Chol/HDL Ratio: 2.6 ratio (ref 0.0–5.0)
Cholesterol, Total: 149 mg/dL (ref 100–199)
HDL: 57 mg/dL (ref >39)
LDL Calculated: 70 mg/dL (ref 0–99)
Triglycerides: 111 mg/dL (ref 0–149)
VLDL Cholesterol Cal: 22 mg/dL (ref 5–40)

## 2019-07-14 ENCOUNTER — Other Ambulatory Visit: Payer: Non-veteran care

## 2019-07-27 IMAGING — CT CT HEART MORP W/ CTA COR W/ SCORE W/ CA W/CM &/OR W/O CM
4 of 7 series · 8 of 20 positions shown, 9 images · IV contrast (APPLIED)
Comparison: None.

Addendum:
EXAM:
OVER-READ INTERPRETATION  CT CHEST

The following report is an over-read performed by radiologist Dr.
Ranilyn Yulia [REDACTED] on 11/19/2018. This
over-read does not include interpretation of cardiac or coronary
anatomy or pathology. The coronary calcium score/coronary CTA
interpretation by the cardiologist is attached.
CLINICAL DATA: Chest pain Aortic Aneurysm
Cardiac CTA
MEDICATIONS:
Sub lingual nitro. 4 mg and lopressor mg
TECHNIQUE: The patient was scanned on a Siemens Force 192 scanner. Gantry
rotation speed was 250 msecs. Collimation was. 6 mm . A 120 kV
prospective scan was triggered in the ascending thoracic aorta at
140 HU's with full mA between 30-70% of the R-R interval . Average
HR during the scan was 56 bpm. The 3D data set was interpreted on a
dedicated work station using MPR, MIP and VRT modes. A total of 80
cc of contrast was used.

[Series 6: best diast 70 % · axial · 0.39mm/px · z∈[+1021,+1065]mm · 2 of 333 slices shown, 3 images]
[im 111/333  vessel]
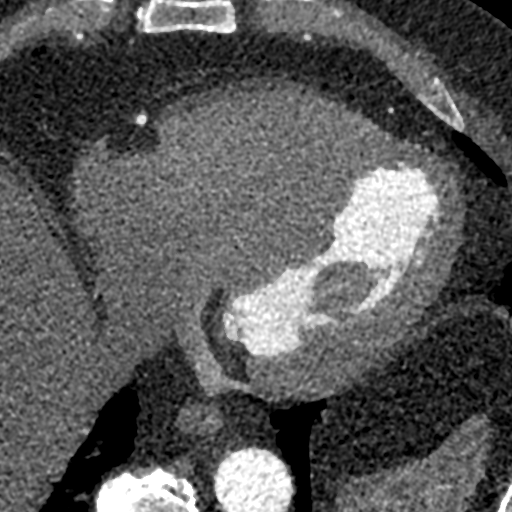
[im 111/333  lung]
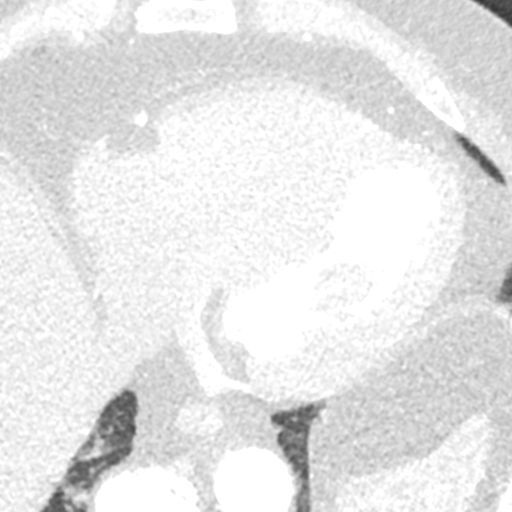
[im 222/333  vessel]
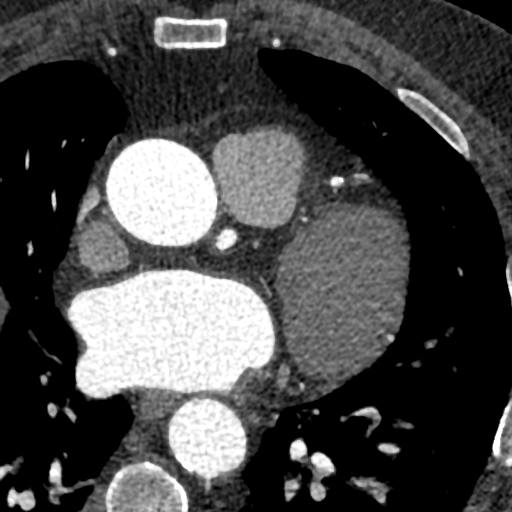

[Series 7: best syst 33 % · axial · 0.39mm/px · z∈[+1021,+1065]mm · 2 of 333 slices shown]
[im 111/333  vessel]
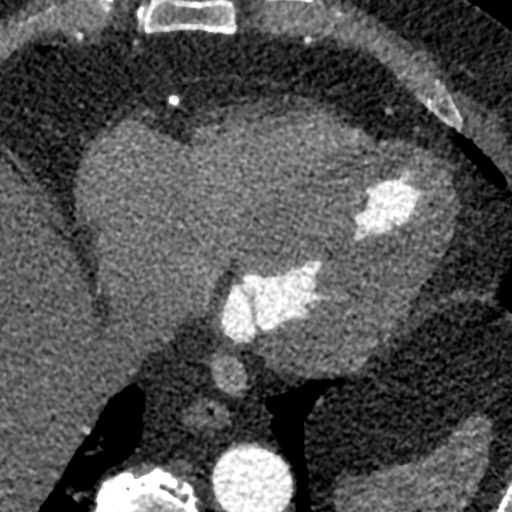
[im 222/333  vessel]
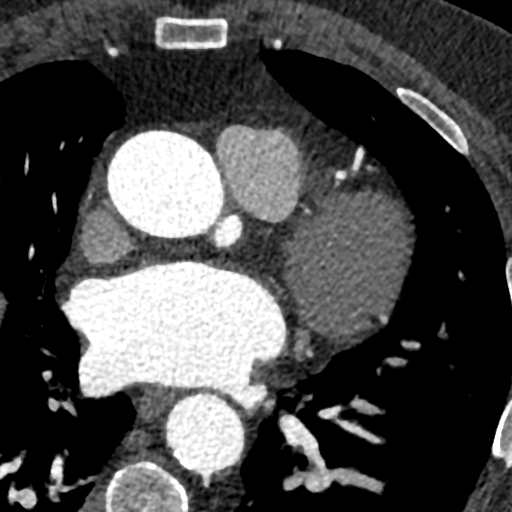

[Series 8: ts diast sharp 70 % · axial · 0.39mm/px · z∈[+1021,+1065]mm · 2 of 333 slices shown]
[im 111/333  lung]
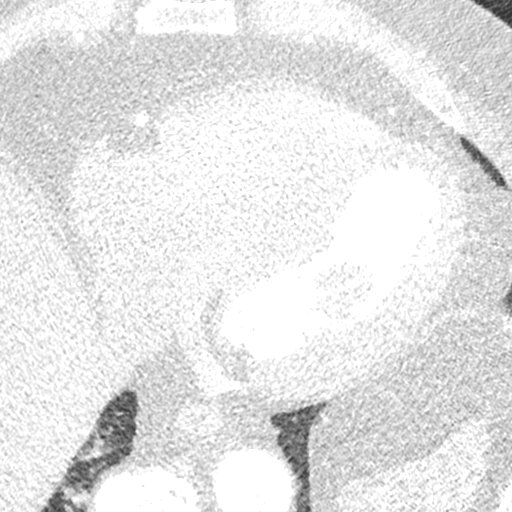
[im 222/333  lung]
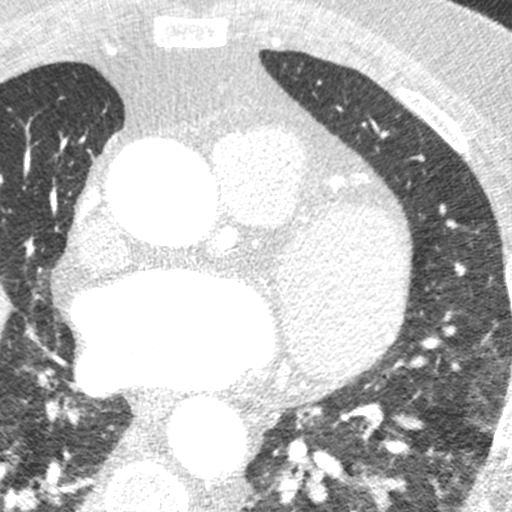

[Series 9: ts syst sharp 33 % · axial · 0.39mm/px · z∈[+1021,+1065]mm · 2 of 333 slices shown]
[im 111/333  lung]
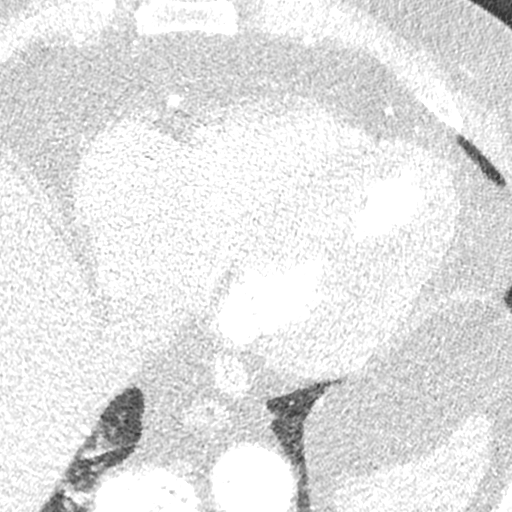
[im 222/333  lung]
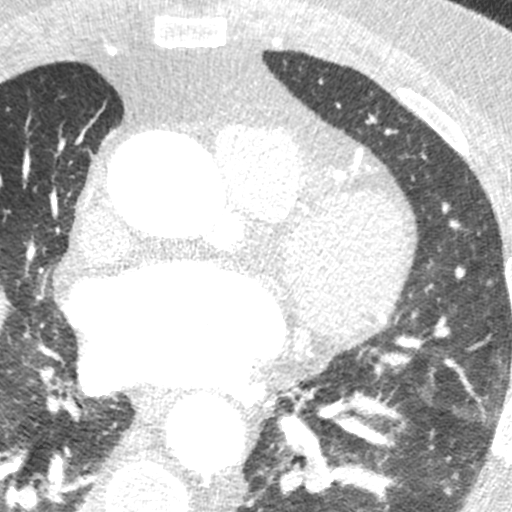

[8 of 20 positions shown; findings below may reference images not displayed]

FINDINGS: Aortic atherosclerosis. Within the visualized portions of the thorax
there are no suspicious appearing pulmonary nodules or masses, there
is no acute consolidative airspace disease, no pleural effusions, no
pneumothorax and no lymphadenopathy. Visualized portions of the
upper abdomen are unremarkable. There are no aggressive appearing
lytic or blastic lesions noted in the visualized portions of the
skeleton.
IMPRESSION: 1.  Aortic Atherosclerosis ([id number]).
FINDINGS: Non-cardiac: See separate report from [REDACTED]. No
significant findings on limited lung and soft tissue windows.

Calcium score: Calcium noted in all 3 major epicardial vessels

Coronary Arteries: Right dominant with no anomalies

LM: Normal

LAD: Less than 30% calcific plaque in proximal and mid vessel

D1: Normal

D2: Normal

Circumflex: Less than 50% calcific plaque proximally

OM1: Normal

OM2: Normal

RCA: Less than 50% calcific plaque in proximal and distal vessel

PDA: Normal

PLA: Less than 50% calcific plaque
IMPRESSION: 1.  Stable aortic root dilatation 4.0 cm

2.  Non obstructive CAD see description above

3.  Calcium score 413 which is 66 th percentile for age and sex

Dhurma Gossieaux

*** End of Addendum ***

## 2019-08-07 ENCOUNTER — Telehealth: Payer: Self-pay | Admitting: Cardiovascular Disease

## 2019-08-07 NOTE — Telephone Encounter (Signed)
Attempted to call pt... fast busy signal x2.. will try again this afternoon.

## 2019-08-07 NOTE — Telephone Encounter (Signed)
Pt c/o BP issue: STAT if pt c/o blurred vision, one-sided weakness or slurred speech  1. What are your last 5 BP readings? 102/66 HR 66 yesterday       98/62 HR 55      95/66 HR 57  this morning      100/65 HR 59  2. Are you having any other symptoms (ex. Dizziness, headache, blurred vision, passed out)? Been feeling very tired and washed out. Some times when he gets up he might feel light dizzy but it doesn't last long.   3. What is your BP issue? Patient states he is on losartan 100mg , he thinks it might be affecting his potassium and magnesium

## 2019-08-10 NOTE — Telephone Encounter (Signed)
I spoke to the patient who wanted to make [place] aware that he recently lost 23 lbs and his BP has begun to decrease 90-100/60s with feelings of weakness, tired, lightheaded and dizzy at times upon standing.  He is inquiring about reducing the Losartan 100 mg Daily amount.    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  Please advise, thank you.

## 2019-08-10 NOTE — Telephone Encounter (Signed)
° °  Follow-up:  Patient called the office on Friday, but has not heard anything from the office yet. Please call to discuss med issue

## 2019-08-11 NOTE — Telephone Encounter (Signed)
Cozaar has been removed from patient's medication list

## 2019-08-11 NOTE — Telephone Encounter (Signed)
I spoke with him on phone already He will stop cozaar and restart at 1/2 dose if his systolic BP is over 445 mmHg

## 2019-08-19 ENCOUNTER — Telehealth (INDEPENDENT_AMBULATORY_CARE_PROVIDER_SITE_OTHER): Payer: No Typology Code available for payment source | Admitting: Family Medicine

## 2019-08-19 ENCOUNTER — Encounter: Payer: Self-pay | Admitting: Family Medicine

## 2019-08-19 DIAGNOSIS — R509 Fever, unspecified: Secondary | ICD-10-CM | POA: Diagnosis not present

## 2019-08-19 DIAGNOSIS — J22 Unspecified acute lower respiratory infection: Secondary | ICD-10-CM | POA: Diagnosis not present

## 2019-08-19 MED ORDER — AZITHROMYCIN 250 MG PO TABS: ORAL_TABLET | ORAL | 0 refills | Status: DC

## 2019-08-19 NOTE — Progress Notes (Signed)
Established Patient Office Visit  Subjective:  Patient ID: Jordan Baldwin, male    DOB: 30-Sep-1946  Age: 73 y.o. MRN: [id number]  CC:  Chief Complaint  Patient presents with  . Cough    x a couple weeks  . Fever    101    HPI Jordan Baldwin presents for a 10-day history of intermittent fever, malaise with a nonproductive cough.  Patient denies postnasal drip wheezing, chest pain, shortness of breath or any difficulty breathing.  He was seen at urgent care Tuesday a week ago and received rapid testing for COVID.  He was told that was negative.  Cough persists with intermittent fever.  He has no asthma history.  Distant history of tobacco use.  He feels as though he is having amplified pain and existing arthritic joints.  He is seen at the [facility] for his primary care but would like to also establish with me.  He rarely drinks alcohol and does not use illicit drugs.  He lives at home with his wife.  Apparently she was tested at the same time.  Past Medical History:  Diagnosis Date  . ANEMIA-NOS 11/29/2008   Qualifier: Diagnosis of  By: Linna Darner MD, [place] INSUFFICIENCY 10/18/2010   Qualifier: Diagnosis of  By: Ron Parker, MD, Leonidas Romberg Dorinda Hill   . Arthritis    "hands, feet, shoulders, back" (07/29/2013)  . ARTHROSCOPY, RIGHT KNEE, HX OF 11/29/2008   Qualifier: Diagnosis of  By: Emmaline Kluver CMA, [place]    . Atelectasis of right lung 07/28/2013  . Basal cell cancer    "neck, right arm" (07/29/2013)  . Fracture of rib of right side 07/28/2013  . Heart murmur   . Hemothorax on right 07/28/2013   Associated with fall 7 days prior to admission and right 10th rib fracture  . High cholesterol   . History of kidney stones   . HYPERLIPIDEMIA 11/29/2008   Qualifier: Diagnosis of  By: Linna Darner MD, Gwyndolyn Saxon    . Hypertension   . IBS 11/29/2008   Qualifier: Diagnosis of  By: Emmaline Kluver CMA, [place]    . Kidney stones    "passed them all" (07/29/2013)  . NEOPLASM, SKIN 11/29/2008   Qualifier: Diagnosis of   By: Emmaline Kluver CMA, [place]    . OVERWEIGHT 10/18/2010   Qualifier: Diagnosis of  By: Ron Parker, MD, Caffie Damme   . PEPTIC ULCER DISEASE 11/29/2008   Qualifier: Diagnosis of  By: Emmaline Kluver CMA, [place]    . RENAL CALCULUS, HX OF 11/29/2008   Qualifier: Diagnosis of  By: Emmaline Kluver CMA, Chrae    . Shortness of breath    "not before I broke my rib" (07/29/2013)    Past Surgical History:  Procedure Laterality Date  . ADENOIDECTOMY    . CHEST TUBE INSERTION Right 07/28/2013  . CHOLECYSTECTOMY    . COLONOSCOPY WITH ESOPHAGOGASTRODUODENOSCOPY (EGD)    . ELBOW SURGERY Right    "2 ORs:  lateral detachment of the muscle; clean out bone chips and calcium deposits" (07/29/2013)  . KNEE ARTHROSCOPY Right   . TONSILLECTOMY AND ADENOIDECTOMY    . TOTAL SHOULDER ARTHROPLASTY Left 12/17/2018  . TOTAL SHOULDER ARTHROPLASTY Left 12/17/2018   Procedure: TOTAL SHOULDER ARTHROPLASTY;  Surgeon: Hiram Gash, MD;  Location: [place];  Service: Orthopedics;  Laterality: Left;  Marland Kitchen VIDEO ASSISTED THORACOSCOPY (VATS)/THOROCOTOMY Right 07/30/2013   Procedure: VIDEO ASSISTED THORACOSCOPY (VATS)/THOROCOTOMY;  Surgeon: Rexene Alberts, MD;  Location: [place];  Service: Thoracic;  Laterality: Right;  evacuation of hemothorax    History reviewed. No pertinent family history.  Social History   Socioeconomic History  . Marital status: Married    Spouse name: Not on file  . Number of children: Not on file  . Years of education: Not on file  . Highest education level: Not on file  Occupational History  . Not on file  Social Needs  . Financial resource strain: Not on file  . Food insecurity    Worry: Not on file    Inability: Not on file  . Transportation needs    Medical: Not on file    Non-medical: Not on file  Tobacco Use  . Smoking status: Former Smoker    Packs/day: 0.25    Years: 1.00    Pack years: 0.25    Types: Cigarettes  . Smokeless tobacco: Never Used  . Tobacco comment: 07/29/2013 "smoked cigarettes less  than 1 yr; I sold them; don't remember when I quit"  Substance and Sexual Activity  . Alcohol use: Yes    Comment: very rarely  . Drug use: No  . Sexual activity: Not Currently  Lifestyle  . Physical activity    Days per week: Not on file    Minutes per session: Not on file  . Stress: Not on file  Relationships  . Social Herbalist on phone: Not on file    Gets together: Not on file    Attends religious service: Not on file    Active member of club or organization: Not on file    Attends meetings of clubs or organizations: Not on file    Relationship status: Not on file  . Intimate partner violence    Fear of current or ex partner: Not on file    Emotionally abused: Not on file    Physically abused: Not on file    Forced sexual activity: Not on file  Other Topics Concern  . Not on file  Social History Narrative  . Not on file    Outpatient Medications Prior to Visit  Medication Sig Dispense Refill  . chlorthalidone (HYGROTON) 25 MG tablet Take 1 tablet (25 mg total) by mouth daily. 30 tablet 11  . vitamin B-12 (CYANOCOBALAMIN) 100 MCG tablet Take 100 mcg by mouth daily.    . rosuvastatin (CRESTOR) 5 MG tablet Take 1 tablet (5 mg total) by mouth daily. 90 tablet 3   No facility-administered medications prior to visit.     No Known Allergies  ROS Review of Systems  Constitutional: Positive for fatigue and fever. Negative for chills, diaphoresis and unexpected weight change.  HENT: Negative.   Eyes: Negative for photophobia and visual disturbance.  Respiratory: Positive for cough. Negative for shortness of breath and wheezing.   Gastrointestinal: Positive for constipation. Negative for abdominal pain, anal bleeding, blood in stool, diarrhea, nausea and vomiting.  Endocrine: Negative for polyphagia and polyuria.  Genitourinary: Negative for decreased urine volume, difficulty urinating and frequency.  Musculoskeletal: Positive for arthralgias.  Skin: Negative  for pallor and rash.  Allergic/Immunologic: Negative for immunocompromised state.  Neurological: Negative for speech difficulty and light-headedness.  Hematological: Does not bruise/bleed easily.  Psychiatric/Behavioral: Negative.       Objective:    Physical Exam  Constitutional: He is oriented to person, place, and time. He appears well-developed and well-nourished. No distress.  HENT:  Head: Normocephalic and atraumatic.  Right Ear: External ear normal.  Left Ear: External ear normal.  Eyes: Conjunctivae are normal.  Right eye exhibits no discharge. Left eye exhibits no discharge. No scleral icterus.  Neck: No tracheal deviation present.  Pulmonary/Chest: Effort normal. No stridor.  Neurological: He is alert and oriented to person, place, and time.  Skin: Skin is warm and dry. He is not diaphoretic.  Psychiatric: He has a normal mood and affect. His behavior is normal.    There were no vitals taken for this visit. Wt Readings from Last 3 Encounters:  04/17/19 254 lb 6.4 oz (115.4 kg)  03/09/19 254 lb (115.2 kg)  01/16/19 257 lb (116.6 kg)   BP Readings from Last 3 Encounters:  04/17/19 133/77  03/09/19 (!) 148/86  02/09/19 (!) 162/82   Guideline developer:  UpToDate (see UpToDate for funding source) Date Released: June 2014  Health Maintenance Due  Topic Date Due  . Hepatitis C Screening  26-Sep-1946  . COLONOSCOPY  03/31/1996  . PNA vac Low Risk Adult (1 of 2 - PCV13) 04/01/2011  . TETANUS/TDAP  09/03/2018  . INFLUENZA VACCINE  08/01/2019    There are no preventive care reminders to display for this patient.  Lab Results  Component Value Date   TSH 2.02 06/20/2007   Lab Results  Component Value Date   WBC 5.3 12/09/2018   HGB 14.4 12/09/2018   HCT 43.6 12/09/2018   MCV 87.9 12/09/2018   PLT 249 12/09/2018   Lab Results  Component Value Date   NA 141 06/24/2019   K 3.8 06/24/2019   CO2 25 06/24/2019   GLUCOSE 100 (H) 06/24/2019   BUN 18 06/24/2019    CREATININE 1.26 06/24/2019   BILITOT 0.7 06/24/2019   ALKPHOS 77 06/24/2019   AST 17 06/24/2019   ALT 16 06/24/2019   PROT 6.5 06/24/2019   ALBUMIN 4.4 06/24/2019   CALCIUM 9.3 06/24/2019   ANIONGAP 10 12/09/2018   Lab Results  Component Value Date   CHOL 149 06/24/2019   Lab Results  Component Value Date   HDL 57 06/24/2019   Lab Results  Component Value Date   LDLCALC 70 06/24/2019   Lab Results  Component Value Date   TRIG 111 06/24/2019   Lab Results  Component Value Date   CHOLHDL 2.6 06/24/2019   No results found for: HGBA1C    Assessment & Plan:   Problem List Items Addressed This Visit      Respiratory   Lower respiratory tract infection   Relevant Medications   azithromycin (ZITHROMAX) 250 MG tablet     Other   Fever - Primary   Relevant Orders   Novel Coronavirus, NAA ([facility])      Meds ordered this encounter  Medications  . azithromycin (ZITHROMAX) 250 MG tablet    Sig: Take 2 today and then 1 each day until finished.    Dispense:  6 tablet    Refill:  0    Follow-up: Return in about 1 week (around 08/26/2019), or if symptoms worsen or fail to improve.   Patient has a appointment to see me on Monday.  I advised him to change that to the following week.  He will follow-up with me in a week virtually if his lower respiratory tract infection is not improved.  Virtual Visit via Video Note  I connected with [name] on 08/19/19 at  9:00 AM EDT by a video enabled telemedicine application and verified that I am speaking with the correct person using two identifiers.  Location: Patient: home Provider:    I discussed the limitations  of evaluation and management by telemedicine and the availability of in person appointments. The patient expressed understanding and agreed to proceed.  History of Present Illness:    Observations/Objective:   Assessment and Plan:   Follow Up Instructions:    I discussed the assessment and  treatment plan with the patient. The patient was provided an opportunity to ask questions and all were answered. The patient agreed with the plan and demonstrated an understanding of the instructions.   The patient was advised to call back or seek an in-person evaluation if the symptoms worsen or if the condition fails to improve as anticipated.  I provided 20 minutes of non-face-to-face time during this encounter.   Libby Maw, MD

## 2019-08-20 ENCOUNTER — Other Ambulatory Visit: Payer: Self-pay

## 2019-08-20 DIAGNOSIS — Z20822 Contact with and (suspected) exposure to covid-19: Secondary | ICD-10-CM

## 2019-08-21 LAB — NOVEL CORONAVIRUS, NAA: SARS-CoV-2, NAA: NOT DETECTED

## 2019-08-24 ENCOUNTER — Ambulatory Visit (INDEPENDENT_AMBULATORY_CARE_PROVIDER_SITE_OTHER): Payer: Medicare Other | Admitting: Family Medicine

## 2019-08-24 ENCOUNTER — Ambulatory Visit (INDEPENDENT_AMBULATORY_CARE_PROVIDER_SITE_OTHER): Payer: No Typology Code available for payment source

## 2019-08-24 ENCOUNTER — Other Ambulatory Visit: Payer: Self-pay

## 2019-08-24 ENCOUNTER — Encounter: Payer: Self-pay | Admitting: Family Medicine

## 2019-08-24 VITALS — BP 120/76 | HR 73 | Temp 98.8°F | Ht 70.0 in

## 2019-08-24 DIAGNOSIS — R509 Fever, unspecified: Secondary | ICD-10-CM

## 2019-08-24 DIAGNOSIS — J22 Unspecified acute lower respiratory infection: Secondary | ICD-10-CM

## 2019-08-24 LAB — CBC
HCT: 34.8 % — ABNORMAL LOW (ref 39.0–52.0)
Hemoglobin: 11.8 g/dL — ABNORMAL LOW (ref 13.0–17.0)
MCHC: 34 g/dL (ref 30.0–36.0)
MCV: 86.1 fl (ref 78.0–100.0)
Platelets: 546 10*3/uL — ABNORMAL HIGH (ref 150.0–400.0)
RBC: 4.04 Mil/uL — ABNORMAL LOW (ref 4.22–5.81)
RDW: 12.4 % (ref 11.5–15.5)
WBC: 10.9 10*3/uL — ABNORMAL HIGH (ref 4.0–10.5)

## 2019-08-24 MED ORDER — AMOXICILLIN-POT CLAVULANATE 875-125 MG PO TABS: 1.0000 | ORAL_TABLET | Freq: Two times a day (BID) | ORAL | 0 refills | Status: DC

## 2019-08-24 NOTE — Progress Notes (Signed)
Established Patient Office Visit  Subjective:  Patient ID: Jordan Baldwin, male    DOB: 1946-05-13  Age: 73 y.o. MRN: [id number]  CC:  Chief Complaint  Patient presents with  . Establish Care    HPI Jordan Baldwin presents for follow-up of his cough.  Symptoms have been present now for 3 weeks.  There have been intermittent fevers with weight loss and loss of energy.  He is also dieting.  There is been postnasal drip.  Patient denies facial pressure teeth pain or headache.  He denies wheezing shortness of breath or an asthma history.  He smoked lightly many years ago.  He has finished the Zithromax but does not feel that it is helped.  He has been having neck pain and stiffness that is being treated by his chiropractor.  Past Medical History:  Diagnosis Date  . ANEMIA-NOS 11/29/2008   Qualifier: Diagnosis of  By: Linna Darner MD, [place] INSUFFICIENCY 10/18/2010   Qualifier: Diagnosis of  By: Ron Parker, MD, Leonidas Romberg Dorinda Hill   . Arthritis    "hands, feet, shoulders, back" (07/29/2013)  . ARTHROSCOPY, RIGHT KNEE, HX OF 11/29/2008   Qualifier: Diagnosis of  By: Emmaline Kluver CMA, [place]    . Atelectasis of right lung 07/28/2013  . Basal cell cancer    "neck, right arm" (07/29/2013)  . Fracture of rib of right side 07/28/2013  . Heart murmur   . Hemothorax on right 07/28/2013   Associated with fall 7 days prior to admission and right 10th rib fracture  . High cholesterol   . History of kidney stones   . HYPERLIPIDEMIA 11/29/2008   Qualifier: Diagnosis of  By: Linna Darner MD, Gwyndolyn Saxon    . Hypertension   . IBS 11/29/2008   Qualifier: Diagnosis of  By: Emmaline Kluver CMA, Chrae    . Kidney stones    "passed them all" (07/29/2013)  . NEOPLASM, SKIN 11/29/2008   Qualifier: Diagnosis of  By: Emmaline Kluver CMA, Chrae    . OVERWEIGHT 10/18/2010   Qualifier: Diagnosis of  By: Ron Parker, MD, Caffie Damme   . PEPTIC ULCER DISEASE 11/29/2008   Qualifier: Diagnosis of  By: Emmaline Kluver CMA, Chrae    . RENAL CALCULUS, HX  OF 11/29/2008   Qualifier: Diagnosis of  By: Emmaline Kluver CMA, Chrae    . Shortness of breath    "not before I broke my rib" (07/29/2013)    Past Surgical History:  Procedure Laterality Date  . ADENOIDECTOMY    . CHEST TUBE INSERTION Right 07/28/2013  . CHOLECYSTECTOMY    . COLONOSCOPY WITH ESOPHAGOGASTRODUODENOSCOPY (EGD)    . ELBOW SURGERY Right    "2 ORs:  lateral detachment of the muscle; clean out bone chips and calcium deposits" (07/29/2013)  . KNEE ARTHROSCOPY Right   . TONSILLECTOMY AND ADENOIDECTOMY    . TOTAL SHOULDER ARTHROPLASTY Left 12/17/2018  . TOTAL SHOULDER ARTHROPLASTY Left 12/17/2018   Procedure: TOTAL SHOULDER ARTHROPLASTY;  Surgeon: Hiram Gash, MD;  Location: [place];  Service: Orthopedics;  Laterality: Left;  Marland Kitchen VIDEO ASSISTED THORACOSCOPY (VATS)/THOROCOTOMY Right 07/30/2013   Procedure: VIDEO ASSISTED THORACOSCOPY (VATS)/THOROCOTOMY;  Surgeon: Rexene Alberts, MD;  Location: [place];  Service: Thoracic;  Laterality: Right;  evacuation of hemothorax    History reviewed. No pertinent family history.  Social History   Socioeconomic History  . Marital status: Married    Spouse name: Not on file  . Number of children: Not on file  . Years of education: Not on  file  . Highest education level: Not on file  Occupational History  . Not on file  Social Needs  . Financial resource strain: Not on file  . Food insecurity    Worry: Not on file    Inability: Not on file  . Transportation needs    Medical: Not on file    Non-medical: Not on file  Tobacco Use  . Smoking status: Former Smoker    Packs/day: 0.25    Years: 1.00    Pack years: 0.25    Types: Cigarettes  . Smokeless tobacco: Never Used  . Tobacco comment: 07/29/2013 "smoked cigarettes less than 1 yr; I sold them; don't remember when I quit"  Substance and Sexual Activity  . Alcohol use: Yes    Comment: very rarely  . Drug use: No  . Sexual activity: Not Currently  Lifestyle  . Physical activity    Days  per week: Not on file    Minutes per session: Not on file  . Stress: Not on file  Relationships  . Social Herbalist on phone: Not on file    Gets together: Not on file    Attends religious service: Not on file    Active member of club or organization: Not on file    Attends meetings of clubs or organizations: Not on file    Relationship status: Not on file  . Intimate partner violence    Fear of current or ex partner: Not on file    Emotionally abused: Not on file    Physically abused: Not on file    Forced sexual activity: Not on file  Other Topics Concern  . Not on file  Social History Narrative  . Not on file    Outpatient Medications Prior to Visit  Medication Sig Dispense Refill  . chlorthalidone (HYGROTON) 25 MG tablet Take 1 tablet (25 mg total) by mouth daily. 30 tablet 11  . vitamin B-12 (CYANOCOBALAMIN) 100 MCG tablet Take 100 mcg by mouth daily.    Marland Kitchen azithromycin (ZITHROMAX) 250 MG tablet Take 2 today and then 1 each day until finished. 6 tablet 0   No facility-administered medications prior to visit.     No Known Allergies  ROS Review of Systems  Constitutional: Positive for fatigue, fever and unexpected weight change. Negative for chills.  HENT: Positive for postnasal drip. Negative for congestion, dental problem, rhinorrhea, sinus pressure, sinus pain, sore throat, trouble swallowing and voice change.   Eyes: Negative for photophobia and visual disturbance.  Respiratory: Positive for cough. Negative for chest tightness, shortness of breath and wheezing.   Cardiovascular: Negative.   Gastrointestinal: Negative.  Negative for anal bleeding, blood in stool, constipation, diarrhea, nausea and vomiting.  Endocrine: Negative for polyphagia and polyuria.  Genitourinary: Negative for difficulty urinating, dysuria, frequency, hematuria and urgency.  Musculoskeletal: Positive for neck pain.  Skin: Negative for pallor and rash.  Allergic/Immunologic:  Negative for immunocompromised state.  Neurological: Negative for numbness and headaches.  Hematological: Does not bruise/bleed easily.  Psychiatric/Behavioral: Negative.       Objective:    Physical Exam  Constitutional: He is oriented to person, place, and time. He appears well-developed and well-nourished. No distress.  HENT:  Head: Normocephalic and atraumatic.  Right Ear: External ear normal.  Left Ear: External ear normal.  Mouth/Throat: Oropharynx is clear and moist. No oropharyngeal exudate.  Eyes: Pupils are equal, round, and reactive to light. Conjunctivae are normal. Right eye exhibits no discharge. Left  eye exhibits no discharge. No scleral icterus.  Neck: Neck supple. No JVD present. No tracheal deviation present. No thyromegaly present.  Cardiovascular: Normal rate, regular rhythm and normal heart sounds.  Pulmonary/Chest: Effort normal and breath sounds normal. No stridor. No respiratory distress. He has no wheezes. He has no rales.  Abdominal: Bowel sounds are normal.  Lymphadenopathy:    He has no cervical adenopathy.  Neurological: He is alert and oriented to person, place, and time.  Skin: Skin is warm and dry. He is not diaphoretic.  Psychiatric: He has a normal mood and affect.    BP 120/76   Pulse 73   Temp 98.8 F (37.1 C) (Oral)   Ht 5\' 10"  (1.778 m)   SpO2 96%   BMI 36.50 kg/m  Wt Readings from Last 3 Encounters:  04/17/19 254 lb 6.4 oz (115.4 kg)  03/09/19 254 lb (115.2 kg)  01/16/19 257 lb (116.6 kg)   BP Readings from Last 3 Encounters:  08/24/19 120/76  04/17/19 133/77  03/09/19 (!) 148/86   Guideline developer:  UpToDate (see UpToDate for funding source) Date Released: June 2014  Health Maintenance Due  Topic Date Due  . Hepatitis C Screening  [birth year]  . COLONOSCOPY  03/31/1996  . PNA vac Low Risk Adult (1 of 2 - PCV13) 04/01/2011  . TETANUS/TDAP  09/03/2018  . INFLUENZA VACCINE  08/01/2019    There are no preventive care  reminders to display for this patient.  Lab Results  Component Value Date   TSH 2.02 06/20/2007   Lab Results  Component Value Date   WBC 5.3 12/09/2018   HGB 14.4 12/09/2018   HCT 43.6 12/09/2018   MCV 87.9 12/09/2018   PLT 249 12/09/2018   Lab Results  Component Value Date   NA 141 06/24/2019   K 3.8 06/24/2019   CO2 25 06/24/2019   GLUCOSE 100 (H) 06/24/2019   BUN 18 06/24/2019   CREATININE 1.26 06/24/2019   BILITOT 0.7 06/24/2019   ALKPHOS 77 06/24/2019   AST 17 06/24/2019   ALT 16 06/24/2019   PROT 6.5 06/24/2019   ALBUMIN 4.4 06/24/2019   CALCIUM 9.3 06/24/2019   ANIONGAP 10 12/09/2018   Lab Results  Component Value Date   CHOL 149 06/24/2019   Lab Results  Component Value Date   HDL 57 06/24/2019   Lab Results  Component Value Date   LDLCALC 70 06/24/2019   Lab Results  Component Value Date   TRIG 111 06/24/2019   Lab Results  Component Value Date   CHOLHDL 2.6 06/24/2019   No results found for: HGBA1C    Assessment & Plan:   Problem List Items Addressed This Visit      Respiratory   Lower respiratory tract infection   Relevant Medications   amoxicillin-clavulanate (AUGMENTIN) 875-125 MG tablet   Other Relevant Orders   DG Chest 2 View   CBC     Other   Fever - Primary   Relevant Orders   CBC      Meds ordered this encounter  Medications  . amoxicillin-clavulanate (AUGMENTIN) 875-125 MG tablet    Sig: Take 1 tablet by mouth 2 (two) times daily.    Dispense:  20 tablet    Refill:  0    Follow-up: Return if symptoms worsen or fail to improve.

## 2019-08-24 NOTE — Addendum Note (Signed)
Addended by: Lynnea Ferrier on: 08/24/2019 11:35 AM   Modules accepted: Orders

## 2019-10-29 ENCOUNTER — Ambulatory Visit (HOSPITAL_COMMUNITY): Payer: Medicare Other | Attending: Cardiovascular Disease

## 2019-10-29 ENCOUNTER — Other Ambulatory Visit: Payer: Self-pay

## 2019-10-29 DIAGNOSIS — I350 Nonrheumatic aortic (valve) stenosis: Secondary | ICD-10-CM | POA: Diagnosis present

## 2020-02-01 ENCOUNTER — Other Ambulatory Visit: Payer: Self-pay | Admitting: Cardiovascular Disease

## 2020-02-01 MED ORDER — CHLORTHALIDONE 25 MG PO TABS: 25.0000 mg | ORAL_TABLET | Freq: Every day | ORAL | 2 refills | Status: DC

## 2020-02-01 NOTE — Telephone Encounter (Signed)
Pt's medication was sent to pt's pharmacy as requested. Confirmation received.  °

## 2020-02-05 ENCOUNTER — Other Ambulatory Visit: Payer: Self-pay | Admitting: Cardiovascular Disease

## 2020-02-11 ENCOUNTER — Other Ambulatory Visit: Payer: Self-pay | Admitting: Cardiovascular Disease

## 2020-02-11 MED ORDER — CHLORTHALIDONE 25 MG PO TABS: 25.0000 mg | ORAL_TABLET | Freq: Every day | ORAL | 0 refills | Status: DC

## 2020-02-11 NOTE — Telephone Encounter (Signed)
*  STAT* If patient is at the pharmacy, call can be transferred to refill team.   1. Which medications need to be refilled? (please list name of each medication and dose if known) chlorthalidone (HYGROTON) 25 MG tablet   2. Which pharmacy/location (including street and city if local pharmacy) is medication to be sent to? [facility] [place], [place]  3. Do they need a 30 day or 90 day supply? 90 day  Patient states he would like his medications sent to [facility] not [facility] from now on.

## 2020-03-07 NOTE — Progress Notes (Signed)
Virtual Visit via Telephone Note   This visit type was conducted due to national recommendations for restrictions regarding the COVID-19 Pandemic (e.g. social distancing) in an effort to limit this patient's exposure and mitigate transmission in our community.  Due to his co-morbid illnesses, this patient is at least at moderate risk for complications without adequate follow up.  This format is felt to be most appropriate for this patient at this time.  The patient did not have access to video technology/had technical difficulties with video requiring transitioning to audio format only (telephone).  All issues noted in this document were discussed and addressed.  No physical exam could be performed with this format.  Please refer to the patient's chart for his  consent to telehealth for [facility].   The patient was identified using 2 identifiers.  Date:  03/08/2020   ID:  Jordan Baldwin, DOB 11/25/1946, MRN [id number]  Patient Location: Home Provider Location: Office  PCP:  [facility]  Cardiologist:  Jenkins Rouge, MD  Electrophysiologist:  None   Evaluation Performed:  Follow-Up Visit  Chief Complaint:  HTN  History of Present Illness:    LINDBURGH Baldwin is a 74 y.o. male with f/u HTN, aortic root dilatation  Distant History of traumatic righ lung hemothorax with chest tube. BP improved on ARB. CTA done . 11/20/219 moderate CAD calcium score 413 66 th percentile  Aortic root 4.0 cm He has been hesitant to start statin despite above average Calcium score. History of murmur TTE reviewed from 10/27/18 mild AS mean gradient 12 mmHg peak 18 mmHg Aortic root was 4.1 cm  Had left total shoulder replacement with Dr Joanie Coddington   No recurrent kidney stones   BP been running high at home Cozaar increased to 100 mg 01/16/19 Long discussion about statin to decrease progression of CAD/AS Willing to try   Today reviewed and discussed with pt : Pt called 08/2019 with low BP int he  0000000 systolic.  So his cozaar was stopped.  He had lost weight.  Just no appetite. + cough and productive. He went through testing in [place], labs,CT of chest and abd/pelvis  Endo/Colonoscopy and COVID testing 4 times, all neg.  Also antibodies for COVID were neg.   He is beginning to feel better and has started walking.  No chest pain no SOB.  He has had COVID vaccine now.    Last labs reviewed from [place] with LDL 69, HDL 44, tG 96 Tchol 132.    He has no chest pain or SOB.   Feeling better Last echo 10/29/19 EF 60-65%, mildly increased LVH, AI,TI and PI trivial.   Aorta at 40 mm     The patient does not have symptoms concerning for COVID-19 infection (fever, chills, cough, or new shortness of breath).    Past Medical History:  Diagnosis Date  . ANEMIA-NOS 11/29/2008   Qualifier: Diagnosis of  By: Linna Darner MD, [place] INSUFFICIENCY 10/18/2010   Qualifier: Diagnosis of  By: Ron Parker, MD, Leonidas Romberg Dorinda Hill   . Arthritis    "hands, feet, shoulders, back" (07/29/2013)  . ARTHROSCOPY, RIGHT KNEE, HX OF 11/29/2008   Qualifier: Diagnosis of  By: Emmaline Kluver CMA, [place]    . Atelectasis of right lung 07/28/2013  . Basal cell cancer    "neck, right arm" (07/29/2013)  . Fracture of rib of right side 07/28/2013  . Heart murmur   . Hemothorax on right 07/28/2013   Associated with fall 7  days prior to admission and right 10th rib fracture  . High cholesterol   . History of kidney stones   . HYPERLIPIDEMIA 11/29/2008   Qualifier: Diagnosis of  By: Linna Darner MD, Gwyndolyn Saxon    . Hypertension   . IBS 11/29/2008   Qualifier: Diagnosis of  By: Emmaline Kluver CMA, [place]    . Kidney stones    "passed them all" (07/29/2013)  . NEOPLASM, SKIN 11/29/2008   Qualifier: Diagnosis of  By: Emmaline Kluver CMA, [place]    . OVERWEIGHT 10/18/2010   Qualifier: Diagnosis of  By: Ron Parker, MD, Caffie Damme   . PEPTIC ULCER DISEASE 11/29/2008   Qualifier: Diagnosis of  By: Emmaline Kluver CMA, [place]    . RENAL CALCULUS, HX OF 11/29/2008    Qualifier: Diagnosis of  By: Emmaline Kluver CMA, [place]    . Shortness of breath    "not before I broke my rib" (07/29/2013)   Past Surgical History:  Procedure Laterality Date  . ADENOIDECTOMY    . CHEST TUBE INSERTION Right 07/28/2013  . CHOLECYSTECTOMY    . COLONOSCOPY WITH ESOPHAGOGASTRODUODENOSCOPY (EGD)    . ELBOW SURGERY Right    "2 ORs:  lateral detachment of the muscle; clean out bone chips and calcium deposits" (07/29/2013)  . KNEE ARTHROSCOPY Right   . TONSILLECTOMY AND ADENOIDECTOMY    . TOTAL SHOULDER ARTHROPLASTY Left 12/17/2018  . TOTAL SHOULDER ARTHROPLASTY Left 12/17/2018   Procedure: TOTAL SHOULDER ARTHROPLASTY;  Surgeon: Hiram Gash, MD;  Location: [facility];  Service: Orthopedics;  Laterality: Left;  Marland Kitchen VIDEO ASSISTED THORACOSCOPY (VATS)/THOROCOTOMY Right 07/30/2013   Procedure: VIDEO ASSISTED THORACOSCOPY (VATS)/THOROCOTOMY;  Surgeon: Rexene Alberts, MD;  Location: [facility];  Service: Thoracic;  Laterality: Right;  evacuation of hemothorax     Current Meds  Medication Sig  . chlorthalidone (HYGROTON) 25 MG tablet Take 1 tablet (25 mg total) by mouth daily.  . rosuvastatin (CRESTOR) 5 MG tablet Take 5 mg by mouth daily.  . vitamin B-12 (CYANOCOBALAMIN) 100 MCG tablet Take 100 mcg by mouth daily.     Allergies:   Patient has no known allergies.   Social History   Tobacco Use  . Smoking status: Former Smoker    Packs/day: 0.25    Years: 1.00    Pack years: 0.25    Types: Cigarettes  . Smokeless tobacco: Never Used  . Tobacco comment: 07/29/2013 "smoked cigarettes less than 1 yr; I sold them; don't remember when I quit"  Substance Use Topics  . Alcohol use: Yes    Comment: very rarely  . Drug use: No     Family Hx: The patient's family history is not on file.  ROS:   Please see the history of present illness.    General:no colds or fevers, wt down 30 lbs  Skin:no rashes or ulcers HEENT:no blurred vision, no congestion CV:see HPI PUL:see HPI GI:no diarrhea  constipation or melena, no indigestion GU:no hematuria, no dysuria MS:no joint pain, no claudication Neuro:no syncope, no lightheadedness Endo:no diabetes, no thyroid disease  All other systems reviewed and are negative.   Prior CV studies:   The following studies were reviewed today:  Cardiac CTA 11/19/18 FINDINGS: Non-cardiac: See separate report from [facility]. No significant findings on limited lung and soft tissue windows.  Calcium score: Calcium noted in all 3 major epicardial vessels  Coronary Arteries: Right dominant with no anomalies  LM: Normal  LAD: Less than 30% calcific plaque in proximal and mid vessel  D1: Normal  D2: Normal  Circumflex: Less than 50% calcific plaque proximally  OM1: Normal  OM2: Normal  RCA: Less than 50% calcific plaque in proximal and distal vessel  PDA: Normal  PLA: Less than 50% calcific plaque  IMPRESSION: 1.  Stable aortic root dilatation 4.0 cm  2.  Non obstructive CAD see description above  3.  Calcium score 413 which is 82 th percentile for age and sex  Jenkins Rouge  Echo 10/29/19 1. Left ventricular ejection fraction, by visual estimation, is 60 to 65%. The left ventricle has normal function. There is mildly increased left ventricular hypertrophy. 2. Left ventricular diastolic parameters are consistent with Grade II diastolic dysfunction (pseudonormalization). 3. Global right ventricle has normal systolic function.The right ventricular size is normal. No increase in right ventricular wall thickness. 4. Left atrial size was normal. 5. Right atrial size was normal. 6. The mitral valve is normal in structure. No evidence of mitral valve regurgitation. 7. The tricuspid valve is normal in structure. Tricuspid valve regurgitation is trivial. 8. The aortic valve is tricuspid. Aortic valve regurgitation is trivial. No evidence of aortic valve sclerosis or stenosis. 9. The pulmonic valve was  normal in structure. Pulmonic valve regurgitation is trivial. 10. Aortic dilatation noted. 11. There is mild dilatation of the ascending aorta measuring 40 mm. 12. The atrial septum is grossly normal. Left Ventricle: Left ventricular ejection fraction, by visual estimation, is 60 to 65%. The left ventricle has normal function. There is mildly increased left ventricular hypertrophy. Asymmetric left ventricular hypertrophy. Left ventricular diastolic parameters are consistent with Grade II diastolic dysfunction (pseudonormalization). Right Ventricle: The right ventricular size is normal. No increase in right ventricular wall thickness. Global RV systolic function is has normal systolic function. Left Atrium: Left atrial size was normal in size. Right Atrium: Right atrial size was normal in size Pericardium: There is no evidence of pericardial effusion. Mitral Valve: The mitral valve is normal in structure. No evidence of mitral valve regurgitation  Tricuspid Valve: The tricuspid valve is normal in structure. Tricuspid valve regurgitation is trivial. Aortic Valve: The aortic valve is tricuspid. Aortic valve regurgitation is trivial. The aortic valve is structurally normal, with no evidence of sclerosis or stenosis. Aortic valve mean gradient measures 8.0 mmHg. Aortic valve peak gradient measures 18.3 mmHg. Aortic valve area, by VTI measures 1.84 cm. Pulmonic Valve: The pulmonic valve was normal in structure. Pulmonic valve regurgitation is trivial. Aorta: Aortic dilatation noted. There is mild dilatation of the ascending aorta measuring 40 mm. IAS/Shunts: The atrial septum is grossly normal.  Labs/Other Tests and Data Reviewed:    EKG:  An ECG dated 10/16/18 was personally reviewed today and demonstrated:  SB non specific ST abnromalities, stable no changes   Recent Labs: 06/24/2019: ALT 16; BUN 18; Creatinine, Ser 1.26; Potassium 3.8; Sodium 141 08/24/2019: Hemoglobin 11.8; Platelets 546.0    Recent Lipid Panel Lab Results  Component Value Date/Time   CHOL 149 06/24/2019 08:59 AM   TRIG 111 06/24/2019 08:59 AM   HDL 57 06/24/2019 08:59 AM   CHOLHDL 2.6 06/24/2019 08:59 AM   CHOLHDL 5 10/17/2010 08:48 AM   LDLCALC 70 06/24/2019 08:59 AM   LDLDIRECT 167.6 10/17/2010 08:48 AM    Wt Readings from Last 3 Encounters:  03/08/20 224 lb (101.6 kg)  04/17/19 254 lb 6.4 oz (115.4 kg)  03/09/19 254 lb (115.2 kg)     Objective:    Vital Signs:  BP 130/70   Pulse (!) 56   Ht 5\' 10"  (1.778 m)  Wt 224 lb (101.6 kg)   BMI 32.14 kg/m    VITAL SIGNS:  reviewed  General NAD Pulmonary can speak in complete sentences without SOB Neuro A&O X 3 MAE follows commands  ASSESSMENT & PLAN:    1. HTN now decreased and stable off cozaar due to wt loss.  Monitor 2. dilatated aortic root by cardiac CTA and echo 40 mm continue to monitor 3. HLD tolerating statin crestor without issue  4. AS pk gradient 18.3   COVID-19 Education: The signs and symptoms of COVID-19 were discussed with the patient and how to seek care for testing (follow up with PCP or arrange E-visit).  The importance of social distancing was discussed today.  Time:   Today, I have spent 15 minutes with the patient with telehealth technology discussing the above problems.     Medication Adjustments/Labs and Tests Ordered: Current medicines are reviewed at length with the patient today.  Concerns regarding medicines are outlined above.   Tests Ordered: No orders of the defined types were placed in this encounter.   Medication Changes: No orders of the defined types were placed in this encounter.   Follow Up:  In Person in 6 month(s)  Signed, Cecilie Kicks, NP  03/08/2020 5:59 PM    [place]

## 2020-03-08 ENCOUNTER — Telehealth (INDEPENDENT_AMBULATORY_CARE_PROVIDER_SITE_OTHER): Payer: Medicare PPO | Admitting: Cardiology

## 2020-03-08 ENCOUNTER — Other Ambulatory Visit: Payer: Self-pay

## 2020-03-08 ENCOUNTER — Encounter: Payer: Self-pay | Admitting: Cardiology

## 2020-03-08 VITALS — BP 130/70 | HR 56 | Ht 70.0 in | Wt 224.0 lb

## 2020-03-08 DIAGNOSIS — E785 Hyperlipidemia, unspecified: Secondary | ICD-10-CM

## 2020-03-08 DIAGNOSIS — I77810 Thoracic aortic ectasia: Secondary | ICD-10-CM

## 2020-03-08 DIAGNOSIS — I10 Essential (primary) hypertension: Secondary | ICD-10-CM | POA: Diagnosis not present

## 2020-03-08 DIAGNOSIS — I350 Nonrheumatic aortic (valve) stenosis: Secondary | ICD-10-CM

## 2020-03-08 NOTE — Patient Instructions (Signed)
Medication Instructions:  Your physician recommends that you continue on your current medications as directed. Please refer to the Current Medication list given to you today.  *If you need a refill on your cardiac medications before your next appointment, please call your pharmacy*   Lab Work: None ordered   If you have labs (blood work) drawn today and your tests are completely normal, you will receive your results only by: Marland Kitchen MyChart Message (if you have MyChart) OR . A paper copy in the mail If you have any lab test that is abnormal or we need to change your treatment, we will call you to review the results.   Testing/Procedures: None ordered    Follow-Up: At [facility], you and your health needs are our priority.  As part of our continuing mission to provide you with exceptional heart care, we have created designated Provider Care Teams.  These Care Teams include your primary Cardiologist (physician) and Advanced Practice Providers (APPs -  Physician Assistants and Nurse Practitioners) who all work together to provide you with the care you need, when you need it.  We recommend signing up for the patient portal called "MyChart".  Sign up information is provided on this After Visit Summary.  MyChart is used to connect with patients for Virtual Visits (Telemedicine).  Patients are able to view lab/test results, encounter notes, upcoming appointments, etc.  Non-urgent messages can be sent to your provider as well.   To learn more about what you can do with MyChart, go to [email or web address].    Your next appointment:   6 month(s)  The format for your next appointment:   In Person  Provider:   You may see Jenkins Rouge, MD or one of the following Advanced Practice Providers on your designated Care Team:    Truitt Merle, NP  Cecilie Kicks, NP  Kathyrn Drown, NP    Other Instructions None

## 2020-04-30 IMAGING — DX CHEST - 2 VIEW
2 series · 2 of 2 positions shown · non-contrast
Comparison: PA and lateral chest 09/21/2013.  CT chest 07/30/2013.

CLINICAL DATA: Cough and fatigue for 3 weeks.

EXAM:
CHEST - 2 VIEW

[chest pa]
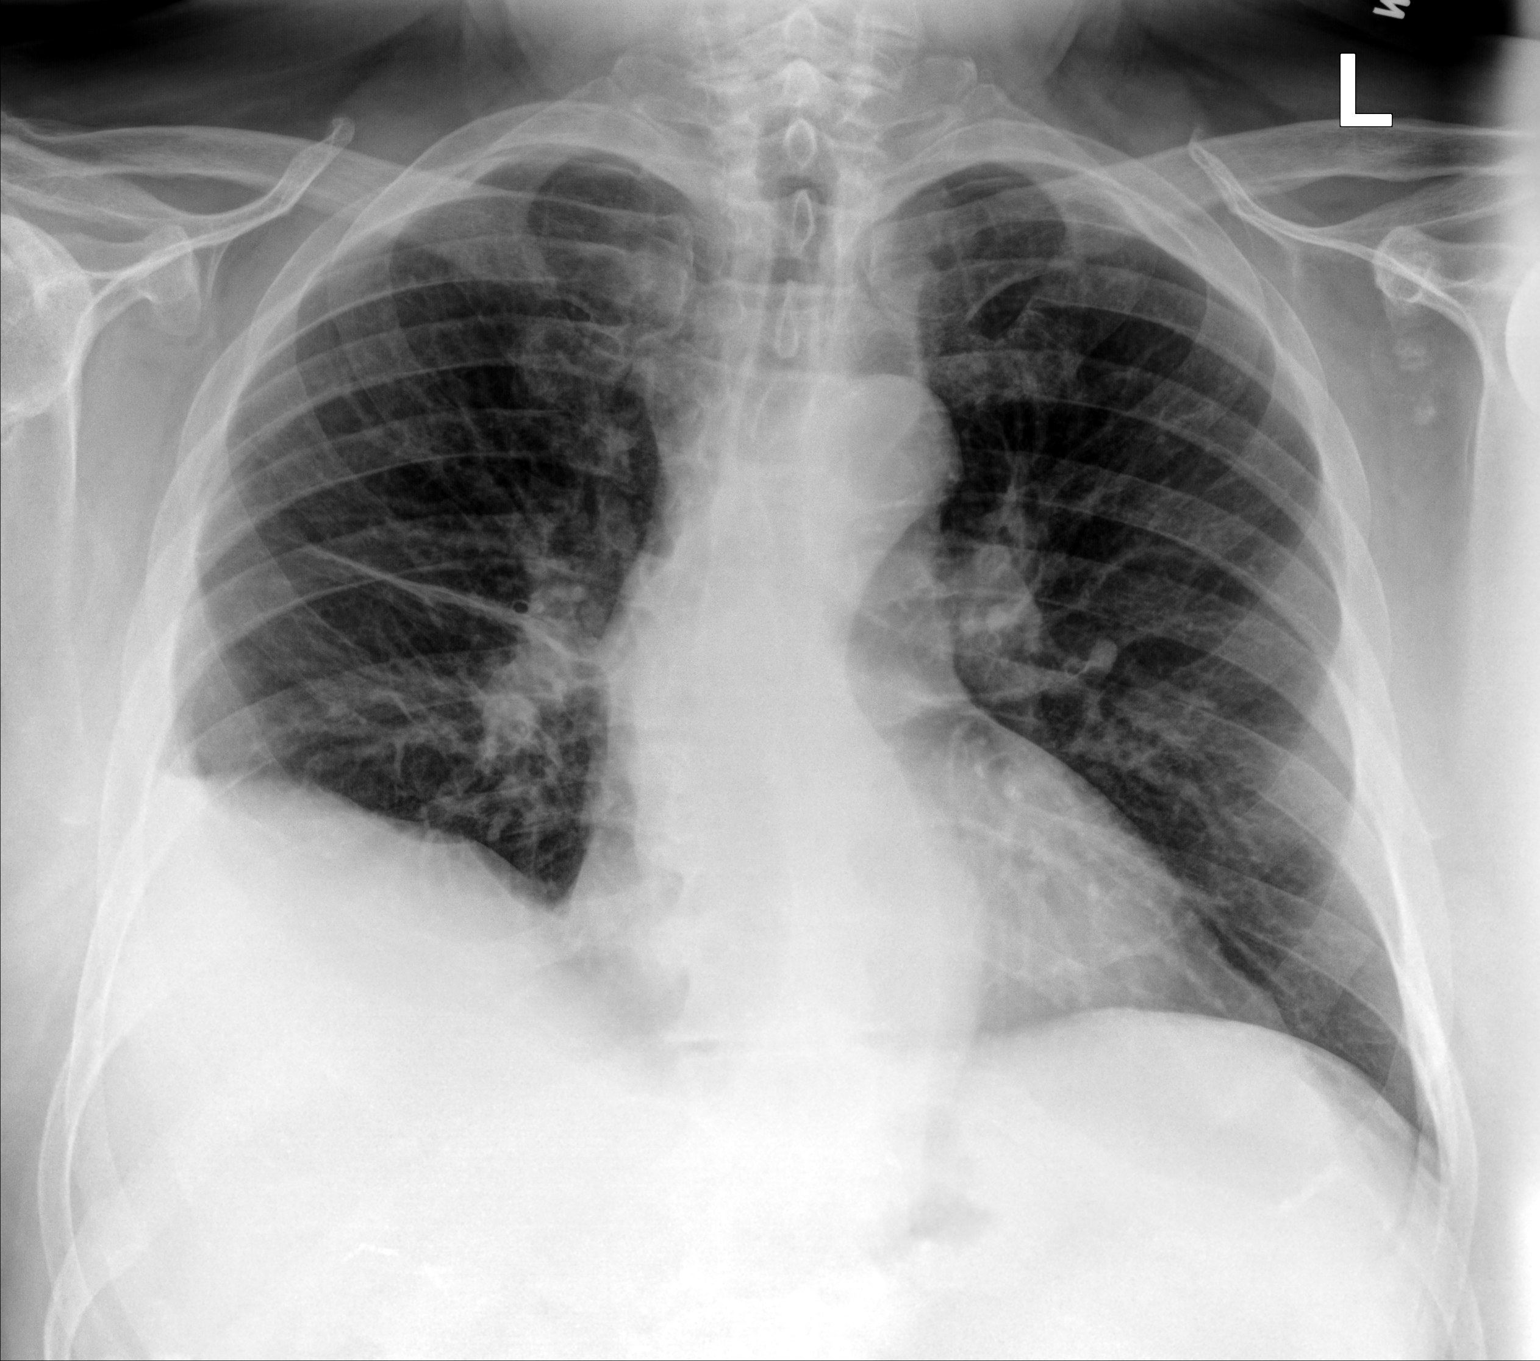

[chest lat]
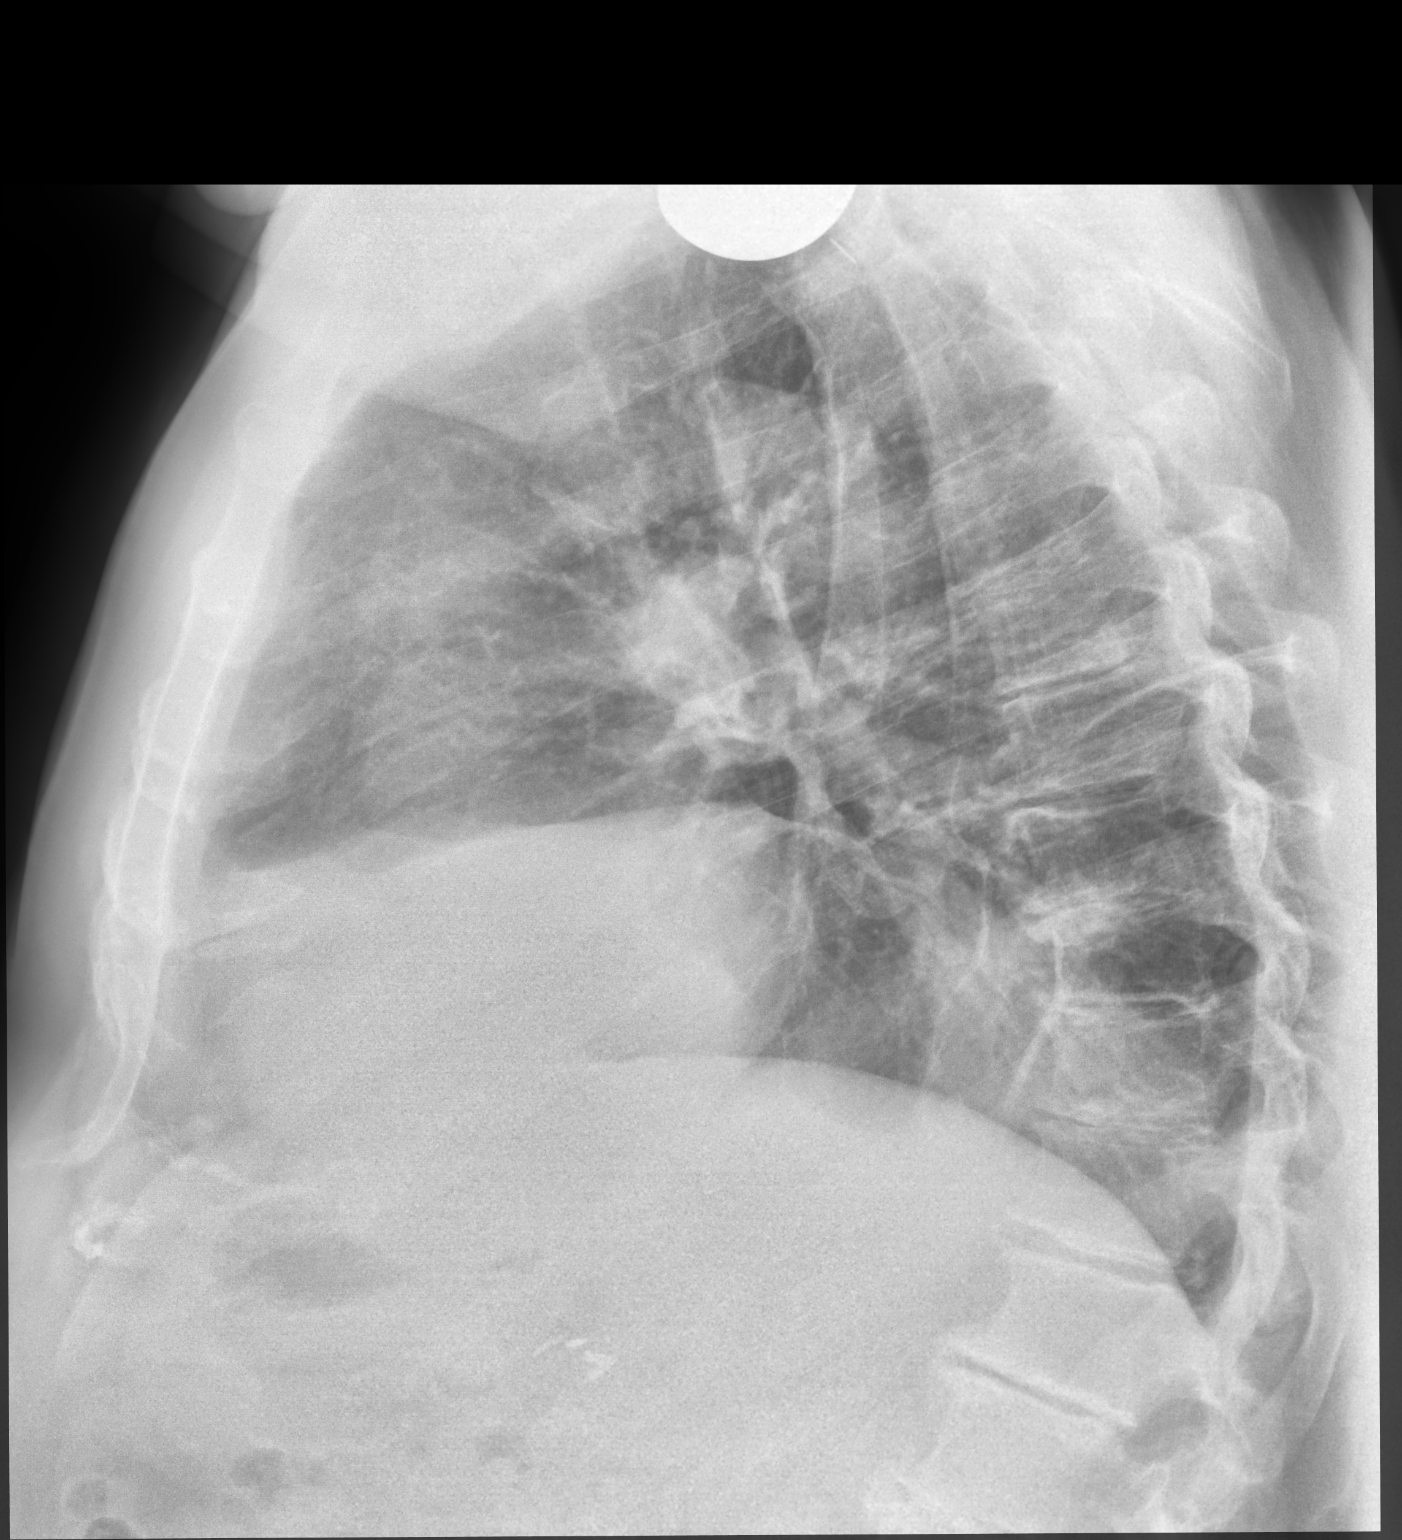

[2 of 2 positions shown; findings below may reference images not displayed]

FINDINGS: Right perihilar scar, elevation of the right hemidiaphragm and
blunting at the right costophrenic angle are unchanged. Lungs are
otherwise clear. No left effusion. No pneumothorax. Atherosclerosis
noted. No acute or focal bony abnormality.
IMPRESSION: No acute disease.

## 2020-05-16 MED ORDER — ROSUVASTATIN CALCIUM 5 MG PO TABS: 5.0000 mg | ORAL_TABLET | Freq: Every day | ORAL | 3 refills | Status: DC

## 2020-05-16 NOTE — Addendum Note (Signed)
Addended by: Carter Kitten D on: 05/16/2020 01:46 PM   Modules accepted: Orders

## 2020-06-14 ENCOUNTER — Other Ambulatory Visit: Payer: Self-pay

## 2020-06-14 MED ORDER — CHLORTHALIDONE 25 MG PO TABS: 25.0000 mg | ORAL_TABLET | Freq: Every day | ORAL | 2 refills | Status: DC

## 2020-09-15 ENCOUNTER — Telehealth: Payer: Self-pay | Admitting: *Deleted

## 2020-09-15 NOTE — Telephone Encounter (Signed)
°  Patient Consent for Virtual Visit         Jordan Baldwin has provided verbal consent on 09/15/2020 for a virtual visit (video or telephone).   CONSENT FOR VIRTUAL VISIT FOR:  Jordan Baldwin  By participating in this virtual visit I agree to the following:  I hereby voluntarily request, consent and authorize [facility] and its employed or contracted physicians, physician assistants, nurse practitioners or other licensed health care professionals (the Practitioner), to provide me with telemedicine health care services (the Services") as deemed necessary by the treating Practitioner. I acknowledge and consent to receive the Services by the Practitioner via telemedicine. I understand that the telemedicine visit will involve communicating with the Practitioner through live audiovisual communication technology and the disclosure of certain medical information by electronic transmission. I acknowledge that I have been given the opportunity to request an in-person assessment or other available alternative prior to the telemedicine visit and am voluntarily participating in the telemedicine visit.  I understand that I have the right to withhold or withdraw my consent to the use of telemedicine in the course of my care at any time, without affecting my right to future care or treatment, and that the Practitioner or I may terminate the telemedicine visit at any time. I understand that I have the right to inspect all information obtained and/or recorded in the course of the telemedicine visit and may receive copies of available information for a reasonable fee.  I understand that some of the potential risks of receiving the Services via telemedicine include:   Delay or interruption in medical evaluation due to technological equipment failure or disruption;  Information transmitted may not be sufficient (e.g. poor resolution of images) to allow for appropriate medical decision making by the Practitioner;  and/or   In rare instances, security protocols could fail, causing a breach of personal health information.  Furthermore, I acknowledge that it is my responsibility to provide information about my medical history, conditions and care that is complete and accurate to the best of my ability. I acknowledge that Practitioner's advice, recommendations, and/or decision may be based on factors not within their control, such as incomplete or inaccurate data provided by me or distortions of diagnostic images or specimens that may result from electronic transmissions. I understand that the practice of medicine is not an exact science and that Practitioner makes no warranties or guarantees regarding treatment outcomes. I acknowledge that a copy of this consent can be made available to me via my patient portal ([place]), or I can request a printed copy by calling the office of [facility].    I understand that my insurance will be billed for this visit.   I have read or had this consent read to me.  I understand the contents of this consent, which adequately explains the benefits and risks of the Services being provided via telemedicine.   I have been provided ample opportunity to ask questions regarding this consent and the Services and have had my questions answered to my satisfaction.  I give my informed consent for the services to be provided through the use of telemedicine in my medical care

## 2020-09-19 NOTE — Progress Notes (Signed)
Telehealth Visit     Virtual Visit via Telephone Note   This visit type was conducted due to national recommendations for restrictions regarding the COVID-19 Pandemic (e.g. social distancing) in an effort to limit this patient's exposure and mitigate transmission in our community.  Due to his co-morbid illnesses, this patient is at least at moderate risk for complications without adequate follow up.  This format is felt to be most appropriate for this patient at this time.  The patient did not have access to video technology/had technical difficulties with video requiring transitioning to audio format only (telephone).  All issues noted in this document were discussed and addressed.  No physical exam could be performed with this format.  Please refer to the patient's chart for his  consent to telehealth for [facility].   Evaluation Performed:  Follow-up visit   The patient was identified using 2 identifiers.   This visit type was conducted due to national recommendations for restrictions regarding the COVID-19 Pandemic (e.g. social distancing).  This format is felt to be most appropriate for this patient at this time.  All issues noted in this document were discussed and addressed.  No physical exam was performed (except for noted visual exam findings with Video Visits).  Please refer to the patient's chart (MyChart message for video visits and phone note for telephone visits) for the patient's consent to telehealth for [facility].  Date:  09/26/2020   ID:  Jordan Baldwin, DOB 23-Apr-1946, MRN [id number]  Patient Location:  Home  Provider location:   Home  PCP:  [facility]  Cardiologist:   Jenkins Rouge, MD  Electrophysiologist:  None   Chief Complaint:  Follow up  History of Present Illness:    Jordan Baldwin is a 74 y.o. male who presents via audio/video conferencing for a telehealth visit today.  Seen for Dr. Johnsie Cancel.   He has a history of HTN, aortic root  dilatation, distant history of traumatic right lung hemothorax with prior chest tube, moderate CAD, HLD with hesitancy to start statin, AS and HTN.   Last seen by Mickel Baas back in March by a virtual visit - felt to be doing ok - but reported lower BP and had not been feeling well with no appetite, cough and weight loss - he had stopped his Losartan - had had extensive testing thru the [place]. He was last seen by Dr. Johnsie Cancel in April of 2020 by a virtual visit as well.   The patient does not have symptoms concerning for COVID-19 infection (fever, chills, cough, or new shortness of breath).   Seen today by telephone call. He has consented for this visit. One issue - had stomach issues last week - went to Urgent Care - negative for COVID. Was given Pepcid and Zofran but wanted to check with [place] - he did have some vomiting x 1. This is improving every day. He had been constipated prior to this and has IBS. He had blood work at the Urgent Care that turned out ok. He is planning on a 2 week camping trip out in [place] at one of the [place] - part of a group. Sounds to be getting better. Does not have a gallbladder. Otherwise doing ok - no chest pain and not short of breath. He has had his labs done by the [place] back in April.   Past Medical History:  Diagnosis Date  . ANEMIA-NOS 11/29/2008   Qualifier: Diagnosis of  By: Linna Darner MD, Gwyndolyn Saxon    .  AORTIC INSUFFICIENCY 10/18/2010   Qualifier: Diagnosis of  By: Ron Parker, MD, Leonidas Romberg Dorinda Hill   . Arthritis    "hands, feet, shoulders, back" (07/29/2013)  . ARTHROSCOPY, RIGHT KNEE, HX OF 11/29/2008   Qualifier: Diagnosis of  By: Emmaline Kluver CMA, Chrae    . Atelectasis of right lung 07/28/2013  . Basal cell cancer    "neck, right arm" (07/29/2013)  . Fracture of rib of right side 07/28/2013  . Heart murmur   . Hemothorax on right 07/28/2013   Associated with fall 7 days prior to admission and right 10th rib fracture  . High cholesterol   . History of kidney stones   .  HYPERLIPIDEMIA 11/29/2008   Qualifier: Diagnosis of  By: Linna Darner MD, Gwyndolyn Saxon    . Hypertension   . IBS 11/29/2008   Qualifier: Diagnosis of  By: Emmaline Kluver CMA, Chrae    . Kidney stones    "passed them all" (07/29/2013)  . NEOPLASM, SKIN 11/29/2008   Qualifier: Diagnosis of  By: Emmaline Kluver CMA, Chrae    . OVERWEIGHT 10/18/2010   Qualifier: Diagnosis of  By: Ron Parker, MD, Caffie Damme   . PEPTIC ULCER DISEASE 11/29/2008   Qualifier: Diagnosis of  By: Emmaline Kluver CMA, Chrae    . RENAL CALCULUS, HX OF 11/29/2008   Qualifier: Diagnosis of  By: Emmaline Kluver CMA, Chrae    . Shortness of breath    "not before I broke my rib" (07/29/2013)   Past Surgical History:  Procedure Laterality Date  . ADENOIDECTOMY    . CHEST TUBE INSERTION Right 07/28/2013  . CHOLECYSTECTOMY    . COLONOSCOPY WITH ESOPHAGOGASTRODUODENOSCOPY (EGD)    . ELBOW SURGERY Right    "2 ORs:  lateral detachment of the muscle; clean out bone chips and calcium deposits" (07/29/2013)  . KNEE ARTHROSCOPY Right   . TONSILLECTOMY AND ADENOIDECTOMY    . TOTAL SHOULDER ARTHROPLASTY Left 12/17/2018  . TOTAL SHOULDER ARTHROPLASTY Left 12/17/2018   Procedure: TOTAL SHOULDER ARTHROPLASTY;  Surgeon: Hiram Gash, MD;  Location: [place];  Service: Orthopedics;  Laterality: Left;  Marland Kitchen VIDEO ASSISTED THORACOSCOPY (VATS)/THOROCOTOMY Right 07/30/2013   Procedure: VIDEO ASSISTED THORACOSCOPY (VATS)/THOROCOTOMY;  Surgeon: Rexene Alberts, MD;  Location: [place];  Service: Thoracic;  Laterality: Right;  evacuation of hemothorax     Current Meds  Medication Sig  . chlorthalidone (HYGROTON) 25 MG tablet Take 1 tablet (25 mg total) by mouth daily.  . Cholecalciferol (VITAMIN D3 PO) Take by mouth.  . Cholecalciferol (VITAMIN D3) 50 MCG (2000 UT) TABS Take 1 tablet by mouth daily.  . rosuvastatin (CRESTOR) 5 MG tablet Take 1 tablet (5 mg total) by mouth daily.  . vitamin B-12 (CYANOCOBALAMIN) 100 MCG tablet Take 100 mcg by mouth daily.     Allergies:   Patient has no  known allergies.   Social History   Tobacco Use  . Smoking status: Former Smoker    Packs/day: 0.25    Years: 1.00    Pack years: 0.25    Types: Cigarettes  . Smokeless tobacco: Never Used  . Tobacco comment: 07/29/2013 "smoked cigarettes less than 1 yr; I sold them; don't remember when I quit"  Vaping Use  . Vaping Use: Never used  Substance Use Topics  . Alcohol use: Yes    Comment: very rarely  . Drug use: No     Family Hx: The patient's family history is not on file.  ROS:   Please see the history of present illness.   All other  systems reviewed are negatve.    Objective:    Vital Signs:  BP 110/86   Pulse 66   Ht 5\' 10"  (1.778 m)   Wt 228 lb (103.4 kg)   BMI 32.71 kg/m    Wt Readings from Last 3 Encounters:  09/26/20 228 lb (103.4 kg)  03/08/20 224 lb (101.6 kg)  04/17/19 254 lb 6.4 oz (115.4 kg)    Alert male in no acute distress. Sounds appropriate.    Labs/Other Tests and Data Reviewed:    Lab Results  Component Value Date   WBC 10.9 (H) 08/24/2019   HGB 11.8 (L) 08/24/2019   HCT 34.8 (L) 08/24/2019   PLT 546.0 (H) 08/24/2019   GLUCOSE 100 (H) 06/24/2019   CHOL 149 06/24/2019   TRIG 111 06/24/2019   HDL 57 06/24/2019   LDLDIRECT 167.6 10/17/2010   LDLCALC 70 06/24/2019   ALT 16 06/24/2019   AST 17 06/24/2019   NA 141 06/24/2019   K 3.8 06/24/2019   CL 101 06/24/2019   CREATININE 1.26 06/24/2019   BUN 18 06/24/2019   CO2 25 06/24/2019   TSH 2.02 06/20/2007   INR 1.03 07/30/2013     BNP (last 3 results) No results for input(s): BNP in the last 8760 hours.  ProBNP (last 3 results) No results for input(s): PROBNP in the last 8760 hours.    Prior CV studies:    The following studies were reviewed today:  Echo 10/29/19  1. Left ventricular ejection fraction, by visual estimation, is 60 to 65%. The left ventricle has normal function. There is mildly increased left ventricular hypertrophy. 2. Left ventricular diastolic parameters  are consistent with Grade II diastolic dysfunction (pseudonormalization). 3. Global right ventricle has normal systolic function.The right ventricular size is normal. No increase in right ventricular wall thickness. 4. Left atrial size was normal. 5. Right atrial size was normal. 6. The mitral valve is normal in structure. No evidence of mitral valve regurgitation. 7. The tricuspid valve is normal in structure. Tricuspid valve regurgitation is trivial. 8. The aortic valve is tricuspid. Aortic valve regurgitation is trivial. No evidence of aortic valve sclerosis or stenosis. 9. The pulmonic valve was normal in structure. Pulmonic valve regurgitation is trivial. 10. Aortic dilatation noted. 11. There is mild dilatation of the ascending aorta measuring 40 mm. 12. The atrial septum is grossly normal.  Cardiac CTA 11/19/18 FINDINGS: Non-cardiac: See separate report from [facility]. No significant findings on limited lung and soft tissue windows.  Calcium score: Calcium noted in all 3 major epicardial vessels  Coronary Arteries: Right dominant with no anomalies  LM: Normal  LAD: Less than 30% calcific plaque in proximal and mid vessel  D1: Normal  D2: Normal  Circumflex: Less than 50% calcific plaque proximally  OM1: Normal  OM2: Normal  RCA: Less than 50% calcific plaque in proximal and distal vessel  PDA: Normal  PLA: Less than 50% calcific plaque  IMPRESSION: 1. Stable aortic root dilatation 4.0 cm  2. Non obstructive CAD see description above  3. Calcium score 413 which is 90 th percentile for age and sex  Jenkins Rouge    ASSESSMENT & PLAN:    1. HTN - great BP control - labs thru [place] - would continue on current regimen.   2. Dilated aortic root - would favor rechecking on return with echo or CT scan.   3. HLD - on statin - [facility]  4. AS - not noted by last echo  5. Recent Gi illness - may be due to his IBS/constipation  - would take the Pepcid and Zofran on upcoming trip to just have on hand.   6. Non obstructive CAD - favor aggressive CV risk factor modification - no worrisome symptoms.   7. COVID-19 Education: The signs and symptoms of COVID-19 were discussed with the patient and how to seek care for testing (follow up with PCP or arrange E-visit).  The importance of social distancing, staying at home, hand hygiene and wearing a mask when out in public were discussed today.  Patient Risk:   After full review of this patient's clinical status, I feel that they are at least moderate risk at this time.  Time:   Today, I have spent 8 minutes with the patient with telehealth technology discussing the above issues.     Medication Adjustments/Labs and Tests Ordered: Current medicines are reviewed at length with the patient today.  Concerns regarding medicines are outlined above.   Tests Ordered: No orders of the defined types were placed in this encounter.   Medication Changes: No orders of the defined types were placed in this encounter.   Disposition:  FU with Dr. Johnsie Cancel in 6 months.      Patient is agreeable to this plan and will call if any problems develop in the interim.   Amie Critchley, NP  09/26/2020 10:35 AM    [place]

## 2020-09-26 ENCOUNTER — Other Ambulatory Visit: Payer: Self-pay

## 2020-09-26 ENCOUNTER — Encounter: Payer: Self-pay | Admitting: Nurse Practitioner

## 2020-09-26 ENCOUNTER — Telehealth (INDEPENDENT_AMBULATORY_CARE_PROVIDER_SITE_OTHER): Payer: Medicare PPO | Admitting: Nurse Practitioner

## 2020-09-26 VITALS — BP 110/86 | HR 66 | Ht 70.0 in | Wt 228.0 lb

## 2020-09-26 DIAGNOSIS — I77810 Thoracic aortic ectasia: Secondary | ICD-10-CM

## 2020-09-26 DIAGNOSIS — I10 Essential (primary) hypertension: Secondary | ICD-10-CM

## 2020-09-26 DIAGNOSIS — Z7189 Other specified counseling: Secondary | ICD-10-CM

## 2020-09-26 DIAGNOSIS — I350 Nonrheumatic aortic (valve) stenosis: Secondary | ICD-10-CM | POA: Diagnosis not present

## 2020-09-26 DIAGNOSIS — E785 Hyperlipidemia, unspecified: Secondary | ICD-10-CM | POA: Diagnosis not present

## 2020-09-26 NOTE — Patient Instructions (Addendum)
After Visit Summary:  We will be checking the following labs today - NONE   Medication Instructions:    Continue with your current medicines.    If you need a refill on your cardiac medications before your next appointment, please call your pharmacy.     Testing/Procedures To Be Arranged:  N/A  Follow-Up:   See Dr. Johnsie Cancel in 6 months    At [facility], you and your health needs are our priority.  As part of our continuing mission to provide you with exceptional heart care, we have created designated Provider Care Teams.  These Care Teams include your primary Cardiologist (physician) and Advanced Practice Providers (APPs -  Physician Assistants and Nurse Practitioners) who all work together to provide you with the care you need, when you need it.  Special Instructions:  . Stay safe, wash your hands for at least 20 seconds and wear a mask when needed.  . It was good to talk with you today.    Call the [place] office at [phone] if you have any questions, problems or concerns.

## 2021-02-23 NOTE — Progress Notes (Deleted)
Telehealth Visit      Date:  02/23/2021   ID:  Jordan Baldwin, Jordan Baldwin 23-Jun-1946, MRN [id number]  PCP:  [facility]  Cardiologist:   Jenkins Rouge, MD  Electrophysiologist:  None   Chief Complaint:  Follow up  History of Present Illness:    75 y.o.  has a history of HTN, aortic root dilatation, distant history of traumatic right lung hemothorax with prior chest tube, moderate CAD, HLD, and HTN. Cardiac CT 11/19/18 calcium score 413 < 50% RCA and circumflex disease Aortic root measured 4.0 cm Measured 4.4 cm by TTE 10/29/19   No chest pain / angina On ASA and statin Last LDL in Epic 70   ***  Past Medical History:  Diagnosis Date  . ANEMIA-NOS 11/29/2008   Qualifier: Diagnosis of  By: Linna Darner MD, [place] INSUFFICIENCY 10/18/2010   Qualifier: Diagnosis of  By: Ron Parker, MD, Leonidas Romberg Dorinda Hill   . Arthritis    "hands, feet, shoulders, back" (07/29/2013)  . ARTHROSCOPY, RIGHT KNEE, HX OF 11/29/2008   Qualifier: Diagnosis of  By: Emmaline Kluver CMA, Chrae    . Atelectasis of right lung 07/28/2013  . Basal cell cancer    "neck, right arm" (07/29/2013)  . Fracture of rib of right side 07/28/2013  . Heart murmur   . Hemothorax on right 07/28/2013   Associated with fall 7 days prior to admission and right 10th rib fracture  . High cholesterol   . History of kidney stones   . HYPERLIPIDEMIA 11/29/2008   Qualifier: Diagnosis of  By: Linna Darner MD, Gwyndolyn Saxon    . Hypertension   . IBS 11/29/2008   Qualifier: Diagnosis of  By: Emmaline Kluver CMA, Chrae    . Kidney stones    "passed them all" (07/29/2013)  . NEOPLASM, SKIN 11/29/2008   Qualifier: Diagnosis of  By: Emmaline Kluver CMA, Chrae    . OVERWEIGHT 10/18/2010   Qualifier: Diagnosis of  By: Ron Parker, MD, Caffie Damme   . PEPTIC ULCER DISEASE 11/29/2008   Qualifier: Diagnosis of  By: Emmaline Kluver CMA, Chrae    . RENAL CALCULUS, HX OF 11/29/2008   Qualifier: Diagnosis of  By: Emmaline Kluver CMA, Chrae    . Shortness of breath    "not before I broke my  rib" (07/29/2013)   Past Surgical History:  Procedure Laterality Date  . ADENOIDECTOMY    . CHEST TUBE INSERTION Right 07/28/2013  . CHOLECYSTECTOMY    . COLONOSCOPY WITH ESOPHAGOGASTRODUODENOSCOPY (EGD)    . ELBOW SURGERY Right    "2 ORs:  lateral detachment of the muscle; clean out bone chips and calcium deposits" (07/29/2013)  . KNEE ARTHROSCOPY Right   . TONSILLECTOMY AND ADENOIDECTOMY    . TOTAL SHOULDER ARTHROPLASTY Left 12/17/2018  . TOTAL SHOULDER ARTHROPLASTY Left 12/17/2018   Procedure: TOTAL SHOULDER ARTHROPLASTY;  Surgeon: Hiram Gash, MD;  Location: [place];  Service: Orthopedics;  Laterality: Left;  Marland Kitchen VIDEO ASSISTED THORACOSCOPY (VATS)/THOROCOTOMY Right 07/30/2013   Procedure: VIDEO ASSISTED THORACOSCOPY (VATS)/THOROCOTOMY;  Surgeon: Rexene Alberts, MD;  Location: [facility];  Service: Thoracic;  Laterality: Right;  evacuation of hemothorax     No outpatient medications have been marked as taking for the 03/01/21 encounter (Appointment) with Josue Hector, MD.     Allergies:   Patient has no known allergies.   Social History   Tobacco Use  . Smoking status: Former Smoker    Packs/day: 0.25    Years: 1.00    Pack years: 0.25  Types: Cigarettes  . Smokeless tobacco: Never Used  . Tobacco comment: 07/29/2013 "smoked cigarettes less than 1 yr; I sold them; don't remember when I quit"  Vaping Use  . Vaping Use: Never used  Substance Use Topics  . Alcohol use: Yes    Comment: very rarely  . Drug use: No     Family Hx: The patient's family history is not on file.  ROS:   Please see the history of present illness.   All other systems reviewed are negatve.    Objective:    Vital Signs:  There were no vitals taken for this visit.   Wt Readings from Last 3 Encounters:  09/26/20 103.4 kg  03/08/20 101.6 kg  04/17/19 115.4 kg    Affect appropriate Healthy:  appears stated age 13: normal Neck supple with no adenopathy JVP normal no bruits no  thyromegaly Lungs clear with no wheezing and good diaphragmatic motion Heart:  S1/S2 no murmur, no rub, gallop or click PMI normal Abdomen: benighn, BS positve, no tenderness, no AAA no bruit.  No HSM or HJR Distal pulses intact with no bruits No edema Neuro non-focal Skin warm and dry No muscular weakness    Labs/Other Tests and Data Reviewed:    Lab Results  Component Value Date   WBC 10.9 (H) 08/24/2019   HGB 11.8 (L) 08/24/2019   HCT 34.8 (L) 08/24/2019   PLT 546.0 (H) 08/24/2019   GLUCOSE 100 (H) 06/24/2019   CHOL 149 06/24/2019   TRIG 111 06/24/2019   HDL 57 06/24/2019   LDLDIRECT 167.6 10/17/2010   LDLCALC 70 06/24/2019   ALT 16 06/24/2019   AST 17 06/24/2019   NA 141 06/24/2019   K 3.8 06/24/2019   CL 101 06/24/2019   CREATININE 1.26 06/24/2019   BUN 18 06/24/2019   CO2 25 06/24/2019   TSH 2.02 06/20/2007   INR 1.03 07/30/2013     BNP (last 3 results) No results for input(s): BNP in the last 8760 hours.  ProBNP (last 3 results) No results for input(s): PROBNP in the last 8760 hours.    Prior CV studies:    The following studies were reviewed today:  Echo 10/29/19  1. Left ventricular ejection fraction, by visual estimation, is 60 to 65%. The left ventricle has normal function. There is mildly increased left ventricular hypertrophy. 2. Left ventricular diastolic parameters are consistent with Grade II diastolic dysfunction (pseudonormalization). 3. Global right ventricle has normal systolic function.The right ventricular size is normal. No increase in right ventricular wall thickness. 4. Left atrial size was normal. 5. Right atrial size was normal. 6. The mitral valve is normal in structure. No evidence of mitral valve regurgitation. 7. The tricuspid valve is normal in structure. Tricuspid valve regurgitation is trivial. 8. The aortic valve is tricuspid. Aortic valve regurgitation is trivial. No evidence of aortic valve sclerosis or  stenosis. 9. The pulmonic valve was normal in structure. Pulmonic valve regurgitation is trivial. 10. Aortic dilatation noted. 11. There is mild dilatation of the ascending aorta measuring 40 mm. 12. The atrial septum is grossly normal.  Cardiac CTA 11/19/18 FINDINGS: Non-cardiac: See separate report from [facility]. No significant findings on limited lung and soft tissue windows.  Calcium score: Calcium noted in all 3 major epicardial vessels  Coronary Arteries: Right dominant with no anomalies  LM: Normal  LAD: Less than 30% calcific plaque in proximal and mid vessel  D1: Normal  D2: Normal  Circumflex: Less than 50% calcific plaque  proximally  OM1: Normal  OM2: Normal  RCA: Less than 50% calcific plaque in proximal and distal vessel  PDA: Normal  PLA: Less than 50% calcific plaque  IMPRESSION: 1. Stable aortic root dilatation 4.0 cm  2. Non obstructive CAD see description above  3. Calcium score 413 which is 70 th percentile for age and sex  Jenkins Rouge    ASSESSMENT & PLAN:    1. HTN - Well controlled.  Continue current medications and low sodium Dash type diet.    2. Dilated aortic root - 4.0 CT 11/19/18 ***  3. HLD - [facility]in - [facility]  4. Non obstructive CAD - CT 11/19/18 calcium score 413 RCA < 50% circumflex < 50% ***    Medication Adjustments/Labs and Tests Ordered: Current medicines are reviewed at length with the patient today.  Concerns regarding medicines are outlined above.   Tests Ordered: No orders of the defined types were placed in this encounter.   Medication Changes: No orders of the defined types were placed in this encounter.   Disposition:  FU  1 year       Patient is agreeable to this plan and will call if any problems develop in the interim.   Signed, Jenkins Rouge, MD  02/23/2021 12:36 PM    [place]

## 2021-03-01 ENCOUNTER — Ambulatory Visit: Payer: Medicare PPO | Admitting: Cardiovascular Disease

## 2021-03-21 NOTE — Progress Notes (Signed)
Telehealth Visit     Virtual Visit via Video Note   This visit type was conducted due to national recommendations for restrictions regarding the COVID-19 Pandemic (e.g. social distancing) in an effort to limit this patient's exposure and mitigate transmission in our community.  Due to her co-morbid illnesses, this patient is at least at moderate risk for complications without adequate follow up.  This format is felt to be most appropriate for this patient at this time.  All issues noted in this document were discussed and addressed.  A limited physical exam was performed with this format.  Please refer to the patient's chart for her consent to telehealth for [facility].   Patient Location: Home Physician Location: Office   Date:  03/27/2021   ID:  Jordan Baldwin, DOB 1946/11/18, MRN [id number]  [facility]  [facility]  Cardiologist:   Jenkins Rouge, MD   Chief Complaint:  Follow up  History of Present Illness:    75 y.o.  has a history of HTN, aortic root dilatation, distant history of traumatic right lung hemothorax with prior chest tube, moderate CAD, HLD, and HTN. Cardiac CT 11/19/18 calcium score 413 < 50% RCA and circumflex disease Aortic root measured 4.0 cm Measured 4.4 cm by TTE 10/29/19   No chest pain / angina On ASA and statin Last LDL in Epic 70   Had an episode of heart racing Had to sit down and take it easy Lasted about 20 minutes Had to urinate twice during episode   Past Medical History:  Diagnosis Date  . ANEMIA-NOS 11/29/2008   Qualifier: Diagnosis of  By: Linna Darner MD, [place] [facility] 10/18/2010   Qualifier: Diagnosis of  By: Ron Parker, MD, Leonidas Romberg Dorinda Hill   . Arthritis    "hands, feet, shoulders, back" (07/29/2013)  . ARTHROSCOPY, RIGHT KNEE, HX OF 11/29/2008   Qualifier: Diagnosis of  By: Emmaline Kluver CMA, [place]    . Atelectasis of right lung 07/28/2013  . Basal cell cancer    "neck, right arm" (07/29/2013)  . Fracture of rib of right side  07/28/2013  . Heart murmur   . Hemothorax on right 07/28/2013   Associated with fall 7 days prior to admission and right 10th rib fracture  . High cholesterol   . History of kidney stones   . HYPERLIPIDEMIA 11/29/2008   Qualifier: Diagnosis of  By: Linna Darner MD, Gwyndolyn Saxon    . Hypertension   . IBS 11/29/2008   Qualifier: Diagnosis of  By: Emmaline Kluver CMA, [place]    . Kidney stones    "passed them all" (07/29/2013)  . NEOPLASM, SKIN 11/29/2008   Qualifier: Diagnosis of  By: Emmaline Kluver CMA, [place]    . OVERWEIGHT 10/18/2010   Qualifier: Diagnosis of  By: Ron Parker, MD, Caffie Damme   . PEPTIC ULCER DISEASE 11/29/2008   Qualifier: Diagnosis of  By: Emmaline Kluver CMA, [place]    . RENAL CALCULUS, HX OF 11/29/2008   Qualifier: Diagnosis of  By: Emmaline Kluver CMA, [place]    . Shortness of breath    "not before I broke my rib" (07/29/2013)   Past Surgical History:  Procedure Laterality Date  . ADENOIDECTOMY    . CHEST TUBE INSERTION Right 07/28/2013  . CHOLECYSTECTOMY    . COLONOSCOPY WITH ESOPHAGOGASTRODUODENOSCOPY (EGD)    . ELBOW SURGERY Right    "2 ORs:  lateral detachment of the muscle; clean out bone chips and calcium deposits" (07/29/2013)  . KNEE ARTHROSCOPY Right   . TONSILLECTOMY AND  ADENOIDECTOMY    . TOTAL SHOULDER ARTHROPLASTY Left 12/17/2018  . TOTAL SHOULDER ARTHROPLASTY Left 12/17/2018   Procedure: TOTAL SHOULDER ARTHROPLASTY;  Surgeon: Hiram Gash, MD;  Location: [place];  Service: Orthopedics;  Laterality: Left;  Marland Kitchen VIDEO ASSISTED THORACOSCOPY (VATS)/THOROCOTOMY Right 07/30/2013   Procedure: VIDEO ASSISTED THORACOSCOPY (VATS)/THOROCOTOMY;  Surgeon: Rexene Alberts, MD;  Location: [place];  Service: Thoracic;  Laterality: Right;  evacuation of hemothorax     Current Meds  Medication Sig  . chlorthalidone (HYGROTON) 25 MG tablet Take 1 tablet (25 mg total) by mouth daily.  . Cholecalciferol (VITAMIN D3) 50 MCG (2000 UT) TABS Take 1 tablet by mouth daily.  . rosuvastatin (CRESTOR) 5 MG tablet Take 1  tablet (5 mg total) by mouth daily.  . vitamin B-12 (CYANOCOBALAMIN) 100 MCG tablet Take 100 mcg by mouth daily.     Allergies:   Patient has no known allergies.   Social History   Tobacco Use  . Smoking status: Former Smoker    Packs/day: 0.25    Years: 1.00    Pack years: 0.25    Types: Cigarettes  . Smokeless tobacco: Never Used  . Tobacco comment: 07/29/2013 "smoked cigarettes less than 1 yr; I sold them; don't remember when I quit"  Vaping Use  . Vaping Use: Never used  Substance Use Topics  . Alcohol use: Yes    Comment: very rarely  . Drug use: No     Family Hx: The patient's family history is not on file.  ROS:   Please see the history of present illness.   All other systems reviewed are negatve.    Objective:    Vital Signs:  BP 123/65   Pulse (!) 53   Ht 5\' 10"  (1.778 m)   Wt 103.9 kg   BMI 32.86 kg/m    Wt Readings from Last 3 Encounters:  03/27/21 103.9 kg  09/26/20 103.4 kg  03/08/20 101.6 kg    No distress No tachypnea No JVP elevation     Labs/Other Tests and Data Reviewed:    Lab Results  Component Value Date   WBC 10.9 (H) 08/24/2019   HGB 11.8 (L) 08/24/2019   HCT 34.8 (L) 08/24/2019   PLT 546.0 (H) 08/24/2019   GLUCOSE 100 (H) 06/24/2019   CHOL 149 06/24/2019   TRIG 111 06/24/2019   HDL 57 06/24/2019   LDLDIRECT 167.6 10/17/2010   LDLCALC 70 06/24/2019   ALT 16 06/24/2019   AST 17 06/24/2019   NA 141 06/24/2019   K 3.8 06/24/2019   CL 101 06/24/2019   CREATININE 1.26 06/24/2019   BUN 18 06/24/2019   CO2 25 06/24/2019   TSH 2.02 06/20/2007   INR 1.03 07/30/2013     BNP (last 3 results) No results for input(s): BNP in the last 8760 hours.  ProBNP (last 3 results) No results for input(s): PROBNP in the last 8760 hours.    Prior CV studies:    The following studies were reviewed today:  Echo 10/29/19  1. Left ventricular ejection fraction, by visual estimation, is 60 to 65%. The left ventricle has normal  function. There is mildly increased left ventricular hypertrophy. 2. Left ventricular diastolic parameters are consistent with Grade II diastolic dysfunction (pseudonormalization). 3. Global right ventricle has normal systolic function.The right ventricular size is normal. No increase in right ventricular wall thickness. 4. Left atrial size was normal. 5. Right atrial size was normal. 6. The mitral valve is normal in structure. No  evidence of mitral valve regurgitation. 7. The tricuspid valve is normal in structure. Tricuspid valve regurgitation is trivial. 8. The aortic valve is tricuspid. Aortic valve regurgitation is trivial. No evidence of aortic valve sclerosis or stenosis. 9. The pulmonic valve was normal in structure. Pulmonic valve regurgitation is trivial. 10. Aortic dilatation noted. 11. There is mild dilatation of the ascending aorta measuring 40 mm. 12. The atrial septum is grossly normal.  Cardiac CTA 11/19/18 FINDINGS: Non-cardiac: See separate report from [facility]. No significant findings on limited lung and soft tissue windows.  Calcium score: Calcium noted in all 3 major epicardial vessels  Coronary Arteries: Right dominant with no anomalies  LM: Normal  LAD: Less than 30% calcific plaque in proximal and mid vessel  D1: Normal  D2: Normal  Circumflex: Less than 50% calcific plaque proximally  OM1: Normal  OM2: Normal  RCA: Less than 50% calcific plaque in proximal and distal vessel  PDA: Normal  PLA: Less than 50% calcific plaque  IMPRESSION: 1. Stable aortic root dilatation 4.0 cm  2. Non obstructive CAD see description above  3. Calcium score 413 which is 98 th percentile for age and sex  Jenkins Rouge    ASSESSMENT & PLAN:    1. HTN - Well controlled.  Continue current medications and low sodium Dash type diet.    2. Dilated aortic root - 4.0 CT 11/19/18 observe  3. HLD - on statin - labs by [place]  4. Non  obstructive CAD - CT 11/19/18 calcium score 413 RCA < 50% circumflex < 50% consider f/u stress myovue in 6 months   5. Palpitations:  Infrequent PRN inderal 10 mg called in can consider monitor if it happens more frequently   Time spent reviewing chart CT, echo  scan direct patient interview and composing note 20 minutes    Medication Adjustments/Labs and Tests Ordered: Current medicines are reviewed at length with the patient today.  Concerns regarding medicines are outlined above.   Tests Ordered: No orders of the defined types were placed in this encounter.   Medication Changes: No orders of the defined types were placed in this encounter. Inderal 10 mg PRN   Disposition:  FU 6 months    Time spent reviewing echo , CT previous notes direct patient interview and composing note 20 mintues   Signed, Jenkins Rouge, MD  03/27/2021 9:09 AM    [place]

## 2021-03-27 ENCOUNTER — Other Ambulatory Visit: Payer: Self-pay

## 2021-03-27 ENCOUNTER — Telehealth (INDEPENDENT_AMBULATORY_CARE_PROVIDER_SITE_OTHER): Payer: Medicare PPO | Admitting: Cardiovascular Disease

## 2021-03-27 ENCOUNTER — Other Ambulatory Visit: Payer: Self-pay | Admitting: Cardiology

## 2021-03-27 VITALS — BP 123/65 | HR 53 | Ht 70.0 in | Wt 229.0 lb

## 2021-03-27 DIAGNOSIS — R002 Palpitations: Secondary | ICD-10-CM

## 2021-03-27 DIAGNOSIS — I2510 Atherosclerotic heart disease of native coronary artery without angina pectoris: Secondary | ICD-10-CM | POA: Diagnosis not present

## 2021-03-27 DIAGNOSIS — I10 Essential (primary) hypertension: Secondary | ICD-10-CM

## 2021-03-27 MED ORDER — PROPRANOLOL HCL 10 MG PO TABS: 10.0000 mg | ORAL_TABLET | ORAL | 11 refills | Status: DC | PRN

## 2021-03-27 NOTE — Patient Instructions (Addendum)
Medication Instructions:  Your physician has recommended you make the following change in your medication:   Take -Inderal (Propranolol) 10 mg by mouth as needed for palpitations. *If you need a refill on your cardiac medications before your next appointment, please call your pharmacy  Lab Work: If you have labs (blood work) drawn today and your tests are completely normal, you will receive your results only by: Marland Kitchen MyChart Message (if you have MyChart) OR . A paper copy in the mail If you have any lab test that is abnormal or we need to change your treatment, we will call you to review the results.  Testing/Procedures: None ordered at this time.  Follow-Up: At [facility], you and your health needs are our priority.  As part of our continuing mission to provide you with exceptional heart care, we have created designated Provider Care Teams.  These Care Teams include your primary Cardiologist (physician) and Advanced Practice Providers (APPs -  Physician Assistants and Nurse Practitioners) who all work together to provide you with the care you need, when you need it.  We recommend signing up for the patient portal called "MyChart".  Sign up information is provided on this After Visit Summary.  MyChart is used to connect with patients for Virtual Visits (Telemedicine).  Patients are able to view lab/test results, encounter notes, upcoming appointments, etc.  Non-urgent messages can be sent to your provider as well.   To learn more about what you can do with MyChart, go to [email or web address].    Your next appointment:   6 month(s)  The format for your next appointment:   In Person  Provider:   You may see Jenkins Rouge, MD or one of the following Advanced Practice Providers on your designated Care Team:    Kathyrn Drown, NP

## 2021-05-25 ENCOUNTER — Other Ambulatory Visit: Payer: Self-pay

## 2021-05-25 MED ORDER — ROSUVASTATIN CALCIUM 5 MG PO TABS: 5.0000 mg | ORAL_TABLET | Freq: Every day | ORAL | 3 refills | Status: DC

## 2021-06-29 ENCOUNTER — Telehealth: Payer: Self-pay | Admitting: *Deleted

## 2021-06-29 NOTE — Telephone Encounter (Signed)
   [facility] Pre-operative Risk Assessment    Patient Name: Jordan Baldwin  DOB: 25-Nov-1946  MRN: [id number]   HEARTCARE STAFF: - Please ensure there is not already an duplicate clearance open for this procedure. - Under Visit Info/Reason for Call, type in Other and utilize the format Clearance MM/DD/YY or Clearance TBD. Do not use dashes or single digits. - If request is for dental extraction, please clarify the # of teeth to be extracted. - If the patient is currently at the dentist's office, call Pre-Op APP to address. If the patient is not currently in the dentist office, please route to the Pre-Op pool  Request for surgical clearance:  What type of surgery is being performed? LEFT KNEE SCOPE   When is this surgery scheduled? TBD   What type of clearance is required (medical clearance vs. Pharmacy clearance to hold med vs. Both)? MEDICAL  Are there any medications that need to be held prior to surgery and how long? NONE LISTED    Practice name and name of physician performing surgery? [facility]; DR. Ophelia Charter   What is the office phone number? [phone]   7.   What is the office fax number? [place].   Anesthesia type (None, local, MAC, general) ? CHOICE   Julaine Hua 06/29/2021, 5:27 PM  _________________________________________________________________   (provider comments below)

## 2021-06-30 NOTE — Telephone Encounter (Signed)
No cardiac complaints at this time.  Reports that he will not have surgery for several months due to planned travel arrangements.  I recommended that he follow-up with Dr. Johnsie Cancel in September/October and review cardiac clearance at that time.

## 2021-10-10 ENCOUNTER — Telehealth: Payer: Self-pay | Admitting: *Deleted

## 2021-10-10 NOTE — Telephone Encounter (Signed)
   [facility] Pre-operative Risk Assessment    Patient Name: Jordan Baldwin  DOB: [birth year] MRN: [id number]  HEARTCARE STAFF:  - IMPORTANT!!!!!! Under Visit Info/Reason for Call, type in Other and utilize the format Clearance MM/DD/YY or Clearance TBD. Do not use dashes or single digits. - Please review there is not already an duplicate clearance open for this procedure. - If request is for dental extraction, please clarify the # of teeth to be extracted. - If the patient is currently at the dentist's office, call Pre-Op Callback Staff (MA/nurse) to input urgent request.  - If the patient is not currently in the dentist office, please route to the Pre-Op pool.  Request for surgical clearance:  What type of surgery is being performed? LEFT KNEE SCOPE  When is this surgery scheduled? TBD  What type of clearance is required (medical clearance vs. Pharmacy clearance to hold med vs. Both)? MEDICAL  Are there any medications that need to be held prior to surgery and how long?  NONE LISTED  Practice name and name of physician performing surgery? [facility]; DR. Ophelia Charter  What is the office phone number? [phone]   7.   What is the office fax number? [place].   Anesthesia type (None, local, MAC, general) ? CHOICE   Julaine Hua 10/10/2021, 2:59 PM  _________________________________________________________________   (provider comments below)

## 2021-10-11 NOTE — Telephone Encounter (Signed)
   Name: Jordan Baldwin  DOB: 1946/11/22  MRN: [id number]  Primary Cardiologist: Jenkins Rouge, MD  Chart reviewed as part of pre-operative protocol coverage. Because of Jordan Baldwin's past medical history and time since last visit, he will require a follow-up visit in order to better assess preoperative cardiovascular risk.  Pre-op covering staff: - Please schedule appointment and call patient to inform them. If patient already had an upcoming appointment within acceptable timeframe, please add "pre-op clearance" to the appointment notes so provider is aware. - Please contact requesting surgeon's office via preferred method (i.e, phone, fax) to inform them of need for appointment prior to surgery.  If applicable, this message will also be routed to pharmacy pool and/or primary cardiologist for input on holding anticoagulant/antiplatelet agent as requested below so that this information is available to the clearing provider at time of patient's appointment.   [place], [place]  10/11/2021, 3:13 PM

## 2021-10-16 NOTE — Telephone Encounter (Signed)
Left message for the pt to call back and schedule an appt for pre op clearance with Dr. Johnsie Cancel or APP.

## 2021-10-17 NOTE — Telephone Encounter (Signed)
I called the pt and apologized that we were going to need to move his appt due to scheduling conflict with previous appt for provider. Pt has not been scheduled to see Laurann Montana, NP at the [place] location 10/27/21 @ 1:30. Pt  is agreeable to plan of care and thanked me for the call. I will send notes to Laurann Montana, NP for appt. Will send notes to surgeon's office as FYI pt appt has been moved to 10/27/21.

## 2021-10-17 NOTE — Telephone Encounter (Signed)
S/w the pt today and he is agreeable to plan of care for an appt for pre op clearance. Pt has been scheduled to see Ermalinda Barrios, [facility] 10/25/21 @ 8:45 (pre op time slot). Pt thanked me for the call and the help. I will send FYI to surgeon's office pt has appt 10/25/21 for pre op assessment.

## 2021-10-25 ENCOUNTER — Ambulatory Visit: Payer: Medicare PPO | Admitting: Physician Assistant

## 2021-10-27 ENCOUNTER — Ambulatory Visit (INDEPENDENT_AMBULATORY_CARE_PROVIDER_SITE_OTHER): Payer: Medicare PPO | Admitting: Family

## 2021-10-27 ENCOUNTER — Other Ambulatory Visit: Payer: Self-pay

## 2021-10-27 ENCOUNTER — Encounter (HOSPITAL_BASED_OUTPATIENT_CLINIC_OR_DEPARTMENT_OTHER): Payer: Self-pay | Admitting: Cardiovascular Disease

## 2021-10-27 ENCOUNTER — Encounter (HOSPITAL_BASED_OUTPATIENT_CLINIC_OR_DEPARTMENT_OTHER): Payer: Self-pay | Admitting: Family

## 2021-10-27 VITALS — BP 120/90 | HR 52 | Ht 70.0 in | Wt 249.5 lb

## 2021-10-27 DIAGNOSIS — I10 Essential (primary) hypertension: Secondary | ICD-10-CM | POA: Diagnosis not present

## 2021-10-27 DIAGNOSIS — E785 Hyperlipidemia, unspecified: Secondary | ICD-10-CM | POA: Diagnosis not present

## 2021-10-27 DIAGNOSIS — Z01810 Encounter for preprocedural cardiovascular examination: Secondary | ICD-10-CM

## 2021-10-27 DIAGNOSIS — I25118 Atherosclerotic heart disease of native coronary artery with other forms of angina pectoris: Secondary | ICD-10-CM | POA: Diagnosis not present

## 2021-10-27 DIAGNOSIS — Z01818 Encounter for other preprocedural examination: Secondary | ICD-10-CM

## 2021-10-27 NOTE — Progress Notes (Addendum)
Office Visit    Patient Name: Jordan Baldwin Date of Encounter: 10/27/2021  PCP:  Administration, [facility]  Cardiologist:  Jenkins Rouge, MD  Advanced Practice Provider:  No care team member to display Electrophysiologist:  None     Chief Complaint    Jordan Baldwin is a 75 y.o. male with a hx of hypertension, aortic root dilation, distal history of traumatic right lung hemothorax with prior chest tube, moderate coronary disease, hyperlipidemia, hypertension presents today for preop clearance for left knee scope.   Past Medical History    Past Medical History:  Diagnosis Date   ANEMIA-NOS 11/29/2008   Qualifier: Diagnosis of  By: Linna Darner MD, Gwyndolyn Saxon     AORTIC INSUFFICIENCY 10/18/2010   Qualifier: Diagnosis of  By: Ron Parker, MD, [facility], Dorinda Hill    Arthritis    "hands, feet, shoulders, back" (07/29/2013)   ARTHROSCOPY, RIGHT KNEE, HX OF 11/29/2008   Qualifier: Diagnosis of  By: Emmaline Kluver CMA, Chrae     Atelectasis of right lung 07/28/2013   Basal cell cancer    "neck, right arm" (07/29/2013)   Fracture of rib of right side 07/28/2013   Heart murmur    Hemothorax on right 07/28/2013   Associated with fall 7 days prior to admission and right 10th rib fracture   High cholesterol    History of kidney stones    HYPERLIPIDEMIA 11/29/2008   Qualifier: Diagnosis of  By: Linna Darner MD, William     Hypertension    IBS 11/29/2008   Qualifier: Diagnosis of  By: Emmaline Kluver CMA, Chrae     Kidney stones    "passed them all" (07/29/2013)   NEOPLASM, SKIN 11/29/2008   Qualifier: Diagnosis of  By: Emmaline Kluver CMA, Chrae     OVERWEIGHT 10/18/2010   Qualifier: Diagnosis of  By: Ron Parker, MD, Caffie Damme    PEPTIC ULCER DISEASE 11/29/2008   Qualifier: Diagnosis of  By: Emmaline Kluver CMA, Chrae     RENAL CALCULUS, HX OF 11/29/2008   Qualifier: Diagnosis of  By: Emmaline Kluver CMA, Chrae     Shortness of breath    "not before I broke my rib" (07/29/2013)   Past Surgical History:   Procedure Laterality Date   ADENOIDECTOMY     CHEST TUBE INSERTION Right 07/28/2013   CHOLECYSTECTOMY     COLONOSCOPY WITH ESOPHAGOGASTRODUODENOSCOPY (EGD)     ELBOW SURGERY Right    "2 ORs:  lateral detachment of the muscle; clean out bone chips and calcium deposits" (07/29/2013)   KNEE ARTHROSCOPY Right    TONSILLECTOMY AND ADENOIDECTOMY     TOTAL SHOULDER ARTHROPLASTY Left 12/17/2018   TOTAL SHOULDER ARTHROPLASTY Left 12/17/2018   Procedure: TOTAL SHOULDER ARTHROPLASTY;  Surgeon: Hiram Gash, MD;  Location: [place];  Service: Orthopedics;  Laterality: Left;   VIDEO ASSISTED THORACOSCOPY (VATS)/THOROCOTOMY Right 07/30/2013   Procedure: VIDEO ASSISTED THORACOSCOPY (VATS)/THOROCOTOMY;  Surgeon: Rexene Alberts, MD;  Location: [place];  Service: Thoracic;  Laterality: Right;  evacuation of hemothorax    Allergies  No Known Allergies  History of Present Illness    Jordan Baldwin is a 75 y.o. male with a hx of hypertension, aortic root dilation, distal history of traumatic right lung hemothorax with prior chest tube, moderate coronary disease, hyperlipidemia, hypertension last seen 03/27/21 via telemedicine. Cardiac CT 11/19/2018 calcium score 413.  There was less than 50% stenosis in the RCA and circumflex.  Aortic root measured 4.0.  He had echocardiogram 10/29/2019 with  measurement of aortic root 4.4 cm.  He was last seen 03/19/2021 via telemedicine.  He reported no chest pain but did note episode of heart racing. He was provided PRN Propranolol.  He presents today for follow up.  Endorses doing overall well from a cardiac perspective.  Monitors blood pressure at home with readings routinely 120-130/80-90.  He has been started on potassium for the last month by the [place] due to hypokalemia in the setting of chlorthalidone use and had labs collected earlier this week but results not yet available. Reports no shortness of breath nor dyspnea on exertion. Reports no chest pain, pressure, or tightness.  No edema, orthopnea, PND. Reports no palpitations.    EKGs/Labs/Other Studies Reviewed:   The following studies were reviewed today:   Echo 10/29/19  1. Left ventricular ejection fraction, by visual estimation, is 60 to 65%. The left ventricle has normal function. There is mildly increased left ventricular hypertrophy. 2. Left ventricular diastolic parameters are consistent with Grade II diastolic dysfunction (pseudonormalization). 3. Global right ventricle has normal systolic function.The right ventricular size is normal. No increase in right ventricular wall thickness. 4. Left atrial size was normal. 5. Right atrial size was normal. 6. The mitral valve is normal in structure. No evidence of mitral valve regurgitation. 7. The tricuspid valve is normal in structure. Tricuspid valve regurgitation is trivial. 8. The aortic valve is tricuspid. Aortic valve regurgitation is trivial. No evidence of aortic valve sclerosis or stenosis. 9. The pulmonic valve was normal in structure. Pulmonic valve regurgitation is trivial. 10. Aortic dilatation noted. 11. There is mild dilatation of the ascending aorta measuring 40 mm. 12. The atrial septum is grossly normal.   Cardiac CTA 11/19/18 FINDINGS: Non-cardiac: See separate report from [facility]. No significant findings on limited lung and soft tissue windows.   Calcium score: Calcium noted in all 3 major epicardial vessels   Coronary Arteries: Right dominant with no anomalies   LM: Normal   LAD: Less than 30% calcific plaque in proximal and mid vessel   D1: Normal   D2: Normal   Circumflex: Less than 50% calcific plaque proximally   OM1: Normal   OM2: Normal   RCA: Less than 50% calcific plaque in proximal and distal vessel   PDA: Normal   PLA: Less than 50% calcific plaque   IMPRESSION: 1.  Stable aortic root dilatation 4.0 cm   2.  Non obstructive CAD see description above   3.  Calcium score 413 which is 67  th percentile for age and sex   Jenkins Rouge  EKG:  EKG is  ordered today.  The ekg ordered today demonstrates SB 52 bpm with incomplete RBBB. TWI noted in lead III.  Recent Labs: No results found for requested labs within last 8760 hours.  Recent Lipid Panel    Component Value Date/Time   CHOL 149 06/24/2019 0859   TRIG 111 06/24/2019 0859   HDL 57 06/24/2019 0859   CHOLHDL 2.6 06/24/2019 0859   CHOLHDL 5 10/17/2010 0848   VLDL 27.4 10/17/2010 0848   LDLCALC 70 06/24/2019 0859   LDLDIRECT 167.6 10/17/2010 0848    Home Medications   Current Meds  Medication Sig   chlorthalidone (HYGROTON) 25 MG tablet TAKE ONE TABLET BY MOUTH DAILY   Cholecalciferol (VITAMIN D3 PO) Take by mouth.   Cholecalciferol (VITAMIN D3) 50 MCG (2000 UT) TABS Take 1 tablet by mouth daily.   famotidine (PEPCID) 10 MG tablet Take 10 mg by mouth as  needed for heartburn or indigestion.   ondansetron (ZOFRAN) 4 MG tablet Take 4 mg by mouth every 8 (eight) hours as needed for nausea or vomiting.   potassium chloride SA (KLOR-CON) 20 MEQ tablet TAKE ONE TABLET BY MOUTH AFTER SUPPER (TAKE WITH FOOD) LOW POTASSIUM   propranolol (INDERAL) 10 MG tablet Take 1 tablet (10 mg total) by mouth as needed (palpitations).   rosuvastatin (CRESTOR) 5 MG tablet Take 1 tablet (5 mg total) by mouth daily.   vitamin B-12 (CYANOCOBALAMIN) 100 MCG tablet Take 100 mcg by mouth daily.    Review of Systems      All other systems reviewed and are otherwise negative except as noted above.  Physical Exam    VS:  BP 120/90   Pulse (!) 52   Ht 5\' 10"  (1.778 m)   Wt 249 lb 8 oz (113.2 kg)   SpO2 97%   BMI 35.80 kg/m  , BMI Body mass index is 35.8 kg/m.  Wt Readings from Last 3 Encounters:  10/27/21 249 lb 8 oz (113.2 kg)  03/27/21 229 lb (103.9 kg)  09/26/20 228 lb (103.4 kg)     GEN: Well nourished, overweight, well developed, in no acute distress. HEENT: normal. Neck: Supple, no JVD, carotid bruits, or masses. Cardiac:  RRR, no murmurs, rubs, or gallops. No clubbing, cyanosis, edema.  Radials/PT 2+ and equal bilaterally.  Respiratory:  Respirations regular and unlabored, clear to auscultation bilaterally. GI: Soft, nontender, nondistended. MS: No deformity or atrophy. Skin: Warm and dry, no rash. Neuro:  Strength and sensation are intact. Psych: Normal affect.  Assessment & Plan   Preoperative clearance -upcoming knee surgery.  Most recent ischemic evaluation with cardiac CTA in 2019.  As such plan for [facility] prior to surgical clearance. Will update requesting party. According to the Revised Cardiac Risk Index (RCRI), his Perioperative Risk of Major Cardiac Event is (%): 0.9. His  Functional Capacity in METs is: 7.34 according to the Duke Activity Status Index (DASI).   Shared Decision Making/Informed Consent The risks [chest pain, shortness of breath, cardiac arrhythmias, dizziness, blood pressure fluctuations, myocardial infarction, stroke/transient ischemic attack, nausea, vomiting, allergic reaction, radiation exposure, metallic taste sensation and life-threatening complications (estimated to be 1 in 10,000)], benefits (risk stratification, diagnosing coronary artery disease, treatment guidance) and alternatives of a nuclear stress test were discussed in detail with Mr. Knecht and he agrees to proceed.   HTN - BP well controlled. Continue current antihypertensive regimen.  Does require PO potassium supplement due to hypokalemia in setting of chlorthalidone use.  He does note mild skin sensitivity with chlorthalidone but it is overall not bothersome and he prefers to continue current regimen.  We discussed that we could trial change to ARB if he wishes.  Dilated aortic root -continue optimal blood pressure control.  Continue mouth periodic echocardiogram.  Hyperlipidemia - Continue Rosuvastatin 5mg  QD. Labs monitored by [place]. Awaiting recent results.   Nonobstructive coronary disease - Stable with no  anginal symptoms. No indication for ischemic evaluation.  GDMT includes rosuvastatin. No BB due to baseline bradycardia. Heart healthy diet and regular cardiovascular exercise encouraged.    Palpitations -no recurrence.  He has not required his as needed propanolol.  Disposition: Follow up in 6 month(s) with Dr. Johnsie Cancel or APP.  Signed, Loel Dubonnet, NP 10/27/2021, 2:19 PM [facility]

## 2021-10-27 NOTE — Patient Instructions (Addendum)
Medication Instructions:  Continue your current medications.   *If you need a refill on your cardiac medications before your next appointment, please call your pharmacy*  Lab Work: None ordered today.   Testing/Procedures: Your EKG today was stable compared to previous.   Your physician has requested that you have a lexiscan myoview.  Please follow instruction sheet, as given.  Follow-Up: At [facility], you and your health needs are our priority.  As part of our continuing mission to provide you with exceptional heart care, we have created designated Provider Care Teams.  These Care Teams include your primary Cardiologist (physician) and Advanced Practice Providers (APPs -  Physician Assistants and Nurse Practitioners) who all work together to provide you with the care you need, when you need it.  We recommend signing up for the patient portal called "MyChart".  Sign up information is provided on this After Visit Summary.  MyChart is used to connect with patients for Virtual Visits (Telemedicine).  Patients are able to view lab/test results, encounter notes, upcoming appointments, etc.  Non-urgent messages can be sent to your provider as well.   To learn more about what you can do with MyChart, go to [email or web address].    Your next appointment:   6 month(s)  The format for your next appointment:   In Person  Provider:   You may see Jenkins Rouge, MD or one of the following Advanced Practice Providers on your designated Care Team:   Cecilie Kicks, NP Loel Dubonnet, NP    Other Instructions  Heart Healthy Diet Recommendations: A low-salt diet is recommended. Meats should be grilled, baked, or boiled. Avoid fried foods. Focus on lean protein sources like fish or chicken with vegetables and fruits. The American Heart Association is a Microbiologist!  American Heart Association Diet and Lifeystyle Recommendations    Exercise recommendations: The American Heart Association  recommends 150 minutes of moderate intensity exercise weekly. Try 30 minutes of moderate intensity exercise 4-5 times per week. This could include walking, jogging, or swimming.    You are scheduled for a Myocardial Perfusion Imaging Study on:  ______________________.  Please arrive 15 minutes prior to your appointment time for registration and insurance purposes.  The test will take approximately 3 to 4 hours to complete; you may bring reading material.  If someone comes with you to your appointment, they will need to remain in the main lobby due to limited space in the testing area. **If you are pregnant or breastfeeding, please notify the nuclear lab prior to your appointment**  How to prepare for your Myocardial Perfusion Test: Do not eat or drink 3 hours prior to your test, except you may have water. Do not consume products containing caffeine (regular or decaffeinated) 12 hours prior to your test. (ex: coffee, chocolate, sodas, tea). Do bring a list of your current medications with you.  If not listed below, you may take your medications as normal. Do not take propranolol (inderal, Innopran) for 24 hours prior to the test.  Bring the medication to your appointment as you may be required to take it once the test is complete. Do wear comfortable clothes (no dresses or overalls) and walking shoes, tennis shoes preferred (No heels or open toe shoes are allowed). Do NOT wear cologne, perfume, aftershave, or lotions (deodorant is allowed). If these instructions are not followed, your test will have to be rescheduled.  Please report to [address] for your test.  If you have  questions or concerns about your appointment, you can call the Nuclear Lab at [phone].  If you cannot keep your appointment, please provide 24 hours notification to the Nuclear Lab, to avoid a possible $50 charge to your account.

## 2021-10-30 ENCOUNTER — Telehealth (HOSPITAL_COMMUNITY): Payer: Self-pay | Admitting: *Deleted

## 2021-10-30 NOTE — Telephone Encounter (Signed)
Patient given detailed instructions per Myocardial Perfusion Study Information Sheet for the test on 11/01/21 Patient notified to arrive 15 minutes early and that it is imperative to arrive on time for appointment to keep from having the test rescheduled.  If you need to cancel or reschedule your appointment, please call the office within 24 hours of your appointment. . Patient verbalized understanding. Danetta Prom Jacqueline   

## 2021-11-01 ENCOUNTER — Other Ambulatory Visit (HOSPITAL_COMMUNITY): Payer: Self-pay | Admitting: Cardiovascular Disease

## 2021-11-01 ENCOUNTER — Ambulatory Visit (HOSPITAL_COMMUNITY): Payer: Medicare PPO | Attending: Cardiovascular Disease

## 2021-11-01 ENCOUNTER — Ambulatory Visit (HOSPITAL_COMMUNITY): Payer: Medicare PPO

## 2021-11-01 ENCOUNTER — Encounter (HOSPITAL_COMMUNITY): Payer: Self-pay

## 2021-11-01 ENCOUNTER — Other Ambulatory Visit: Payer: Self-pay

## 2021-11-01 VITALS — Ht 70.0 in | Wt 249.0 lb

## 2021-11-01 DIAGNOSIS — I2510 Atherosclerotic heart disease of native coronary artery without angina pectoris: Secondary | ICD-10-CM | POA: Diagnosis not present

## 2021-11-01 DIAGNOSIS — I25118 Atherosclerotic heart disease of native coronary artery with other forms of angina pectoris: Secondary | ICD-10-CM | POA: Diagnosis present

## 2021-11-01 DIAGNOSIS — Z01810 Encounter for preprocedural cardiovascular examination: Secondary | ICD-10-CM

## 2021-11-01 DIAGNOSIS — R002 Palpitations: Secondary | ICD-10-CM | POA: Diagnosis present

## 2021-11-01 LAB — MYOCARDIAL PERFUSION IMAGING
LV dias vol: 100 mL (ref 62–150)
LV sys vol: 40 mL
Nuc Stress EF: 60 %
Peak HR: 61 {beats}/min
Rest HR: 48 {beats}/min
Rest Nuclear Isotope Dose: 10.2 mCi
SDS: 0
SRS: 0
SSS: 0
ST Depression (mm): 0 mm
Stress Nuclear Isotope Dose: 30.7 mCi
TID: 1.14

## 2021-11-01 MED ORDER — REGADENOSON 0.4 MG/5ML IV SOLN
0.4000 mg | Freq: Once | INTRAVENOUS | Status: AC
  Administered 2021-11-01: 0.4 mg via INTRAVENOUS

## 2021-11-01 MED ORDER — TECHNETIUM TC 99M TETROFOSMIN IV KIT
32.1000 | PACK | Freq: Once | INTRAVENOUS | Status: AC | PRN
  Administered 2021-11-01: 32.1 via INTRAVENOUS
  Filled 2021-11-01: qty 33

## 2021-11-01 MED ORDER — TECHNETIUM TC 99M TETROFOSMIN IV KIT
11.0000 | PACK | Freq: Once | INTRAVENOUS | Status: AC | PRN
  Administered 2021-11-01: 11 via INTRAVENOUS
  Filled 2021-11-01: qty 11

## 2022-04-17 ENCOUNTER — Other Ambulatory Visit: Payer: Self-pay

## 2022-04-17 MED ORDER — CHLORTHALIDONE 25 MG PO TABS: 25.0000 mg | ORAL_TABLET | Freq: Every day | ORAL | 1 refills | Status: DC

## 2022-06-02 ENCOUNTER — Other Ambulatory Visit: Payer: Self-pay | Admitting: Cardiovascular Disease

## 2022-10-15 ENCOUNTER — Other Ambulatory Visit: Payer: Self-pay | Admitting: Cardiovascular Disease

## 2022-11-09 ENCOUNTER — Other Ambulatory Visit: Payer: Self-pay | Admitting: Cardiovascular Disease

## 2022-11-13 ENCOUNTER — Other Ambulatory Visit: Payer: Self-pay | Admitting: Cardiovascular Disease

## 2022-11-23 ENCOUNTER — Other Ambulatory Visit: Payer: Self-pay | Admitting: Cardiovascular Disease

## 2022-12-10 ENCOUNTER — Other Ambulatory Visit: Payer: Self-pay | Admitting: Cardiovascular Disease

## 2022-12-13 ENCOUNTER — Other Ambulatory Visit: Payer: Self-pay | Admitting: Cardiovascular Disease

## 2023-03-18 ENCOUNTER — Telehealth: Payer: Self-pay | Admitting: Cardiovascular Disease

## 2023-03-18 MED ORDER — CHLORTHALIDONE 25 MG PO TABS: 25.0000 mg | ORAL_TABLET | Freq: Every day | ORAL | 1 refills | Status: DC

## 2023-03-18 NOTE — Telephone Encounter (Signed)
*  STAT* If patient is at the pharmacy, call can be transferred to refill team.   1. Which medications need to be refilled? (please list name of each medication and dose if known)  chlorthalidone (HYGROTON) 25 MG tablet   2. Which pharmacy/location (including street and city if local pharmacy) is medication to be sent to? [facility] on [address]  3. Do they need a 30 day or 90 day supply? 90 day  Patient has appt on 05/13/23.  He is out of medication.

## 2023-03-18 NOTE — Telephone Encounter (Signed)
Pt's medication was sent to pt's pharmacy as requested. Confirmation received.  °

## 2023-05-10 ENCOUNTER — Other Ambulatory Visit: Payer: Self-pay | Admitting: Cardiovascular Disease

## 2023-05-13 ENCOUNTER — Encounter (HOSPITAL_BASED_OUTPATIENT_CLINIC_OR_DEPARTMENT_OTHER): Payer: Self-pay | Admitting: Family

## 2023-05-13 ENCOUNTER — Ambulatory Visit (INDEPENDENT_AMBULATORY_CARE_PROVIDER_SITE_OTHER): Payer: Medicare PPO | Admitting: Family

## 2023-05-13 ENCOUNTER — Encounter (HOSPITAL_BASED_OUTPATIENT_CLINIC_OR_DEPARTMENT_OTHER): Payer: Self-pay

## 2023-05-13 VITALS — BP 128/64 | HR 52 | Ht 70.0 in | Wt 237.0 lb

## 2023-05-13 DIAGNOSIS — E785 Hyperlipidemia, unspecified: Secondary | ICD-10-CM

## 2023-05-13 DIAGNOSIS — I77810 Thoracic aortic ectasia: Secondary | ICD-10-CM

## 2023-05-13 DIAGNOSIS — I25118 Atherosclerotic heart disease of native coronary artery with other forms of angina pectoris: Secondary | ICD-10-CM | POA: Diagnosis not present

## 2023-05-13 DIAGNOSIS — R002 Palpitations: Secondary | ICD-10-CM

## 2023-05-13 DIAGNOSIS — I10 Essential (primary) hypertension: Secondary | ICD-10-CM | POA: Diagnosis not present

## 2023-05-13 MED ORDER — PROPRANOLOL HCL 10 MG PO TABS: 10.0000 mg | ORAL_TABLET | ORAL | 11 refills | Status: DC | PRN

## 2023-05-13 MED ORDER — CHLORTHALIDONE 25 MG PO TABS: 25.0000 mg | ORAL_TABLET | Freq: Every day | ORAL | 3 refills | Status: DC

## 2023-05-13 MED ORDER — ROSUVASTATIN CALCIUM 5 MG PO TABS: 5.0000 mg | ORAL_TABLET | Freq: Every day | ORAL | 3 refills | Status: DC

## 2023-05-13 NOTE — Progress Notes (Signed)
Office Visit    Patient Name: Jordan Baldwin Date of Encounter: 05/13/2023  PCP:  Administration, [facility]  Cardiologist:  Charlton Haws, MD  Advanced Practice Provider:  No care team member to display Electrophysiologist:  None     Chief Complaint    Jordan Baldwin is a 77 y.o. male presents today for hypertension follow up   Past Medical History    Past Medical History:  Diagnosis Date   ANEMIA-NOS 11/29/2008   Qualifier: Diagnosis of  By: Alwyn Ren MD, William     AORTIC INSUFFICIENCY 10/18/2010   Qualifier: Diagnosis of  By: Myrtis Ser, MD, [facility], Lemmie Evens    Arthritis    "hands, feet, shoulders, back" (07/29/2013)   ARTHROSCOPY, RIGHT KNEE, HX OF 11/29/2008   Qualifier: Diagnosis of  By: Marlynn Perking CMA, Ch[place]     Atelectasis of right lung 07/28/2013   Basal cell cancer    "neck, right arm" (07/29/2013)   Fracture of rib of right side 07/28/2013   Heart murmur    Hemothorax on right 07/28/2013   Associated with fall 7 days prior to admission and right 10th rib fracture   High cholesterol    History of kidney stones    HYPERLIPIDEMIA 11/29/2008   Qualifier: Diagnosis of  By: Alwyn Ren MD, William     Hypertension    IBS 11/29/2008   Qualifier: Diagnosis of  By: Marlynn Perking CMA, Chrae     Kidney stones    "passed them all" (07/29/2013)   NEOPLASM, SKIN 11/29/2008   Qualifier: Diagnosis of  By: Marlynn Perking CMA, Chrae     OVERWEIGHT 10/18/2010   Qualifier: Diagnosis of  By: Myrtis Ser, MD, Graciella Belton    PEPTIC ULCER DISEASE 11/29/2008   Qualifier: Diagnosis of  By: Marlynn Perking CMA, Chrae     RENAL CALCULUS, HX OF 11/29/2008   Qualifier: Diagnosis of  By: Marlynn Perking CMA, Chrae     Shortness of breath    "not before I broke my rib" (07/29/2013)   Past Surgical History:  Procedure Laterality Date   ADENOIDECTOMY     CHEST TUBE INSERTION Right 07/28/2013   CHOLECYSTECTOMY     COLONOSCOPY WITH ESOPHAGOGASTRODUODENOSCOPY (EGD)     ELBOW SURGERY Right    "2  ORs:  lateral detachment of the muscle; clean out bone chips and calcium deposits" (07/29/2013)   KNEE ARTHROSCOPY Right    TONSILLECTOMY AND ADENOIDECTOMY     TOTAL SHOULDER ARTHROPLASTY Left 12/17/2018   TOTAL SHOULDER ARTHROPLASTY Left 12/17/2018   Procedure: TOTAL SHOULDER ARTHROPLASTY;  Surgeon: Bjorn Pippin, MD;  Location: [facility] OR;  Service: Orthopedics;  Laterality: Left;   VIDEO ASSISTED THORACOSCOPY (VATS)/THOROCOTOMY Right 07/30/2013   Procedure: VIDEO ASSISTED THORACOSCOPY (VATS)/THOROCOTOMY;  Surgeon: Purcell Nails, MD;  Location: [facility] OR;  Service: Thoracic;  Laterality: Right;  evacuation of hemothorax    Allergies  No Known Allergies  History of Present Illness    Jordan Baldwin is a 77 y.o. male with a hx of hypertension, aortic root dilation, distal history of traumatic right lung hemothorax with prior chest tube, moderate coronary disease, hyperlipidemia, hypertension last seen 10/27/21  Cardiac CT 11/19/2018 calcium score 413.  There was less than 50% stenosis in the RCA and circumflex.  Aortic root measured 4.0.  He had echocardiogram 10/29/2019 with measurement of aortic root 4.4 cm.  Visit 03/19/2021 via telemedicine he reported no chest pain but did note episode of heart racing. He was provided PRN Propranolol.  Last seen 10/27/21 for preop clearance for knee surgery. Potassium supplement had been ordered by [facility] provider due to hypokalemia. Myoview 11/01/21 low risk study and clearance provided.  He presents today for follow up.  Recently enjoyed a trip to [place], [place], and [place]. Exercising at [employer] at [employer]. 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 Blood pressure at home 120s/60s.  Reports no chest pain, pressure, or tightness. No edema, orthopnea, PND. Reports no palpitations.  Has not required as needed propranolol.  PCP visit later this month.   EKGs/Labs/Other Studies Reviewed:   The following studies were  reviewed today:  Cardiac Studies & Procedures     STRESS TESTS  MYOCARDIAL PERFUSION IMAGING 11/01/2021  Narrative   The study is normal. The study is low risk.   No ST deviation was noted.   LV perfusion is normal.   Left ventricular function is normal. Nuclear stress EF: 60 %. The left ventricular ejection fraction is normal (55-65%). End diastolic cavity size is normal.   Prior study not available for comparison.  Low risk stress nuclear study with normal perfusion and normal left ventricular regional and global systolic function.   ECHOCARDIOGRAM  ECHOCARDIOGRAM COMPLETE 10/29/2019  Narrative ECHOCARDIOGRAM REPORT    Patient Name:   Jordan Baldwin Date of Exam: 10/29/2019 Medical Rec #:  [id number]      Height:       70.0 in Accession #:    [id number]     Weight:       254.4 lb Date of Birth:  12-Nov-1946       BSA:          2.31 m Patient Age:    73 years       BP:           134/76 mmHg Patient Gender: M              HR:           52 bpm. Exam Location:  [address]  Procedure: 2D Echo, Cardiac Doppler and Color Doppler  Indications:    I35.0 Nonrheumatic aortic (valve) stenosis  History:        Patient has prior history of Echocardiogram examinations, most recent 10/27/2018. Aortic root dilatation. Overweight.  Sonographer:    Cathie Beams RCS Referring Phys: [id number]ade  IMPRESSIONS   1. Left ventricular ejection fraction, by visual estimation, is 60 to 65%. The left ventricle has normal function. There is mildly increased left ventricular hypertrophy. 2. Left ventricular diastolic parameters are consistent with Grade II diastolic dysfunction (pseudonormalization). 3. Global right ventricle has normal systolic function.The right ventricular size is normal. No increase in right ventricular wall thickness. 4. Left atrial size was normal. 5. Right atrial size was normal. 6. The mitral valve is normal in structure. No evidence of mitral valve  regurgitation. 7. The tricuspid valve is normal in structure. Tricuspid valve regurgitation is trivial. 8. The aortic valve is tricuspid. Aortic valve regurgitation is trivial. No evidence of aortic valve sclerosis or stenosis. 9. The pulmonic valve was normal in structure. Pulmonic valve regurgitation is trivial. 10. Aortic dilatation noted. 11. There is mild dilatation of the ascending aorta measuring 40 mm. 12. The atrial septum is grossly normal.  FINDINGS Left Ventricle: Left ventricular ejection fraction, by visual estimation, is 60 to 65%. The left ventricle has normal function. There is mildly increased left ventricular hypertrophy. Asymmetric left ventricular hypertrophy. Left ventricular diastolic  parameters are consistent with Grade II diastolic dysfunction (pseudonormalization).  Right Ventricle: The right ventricular size is normal. No increase in right ventricular wall thickness. Global RV systolic function is has normal systolic function.  Left Atrium: Left atrial size was normal in size.  Right Atrium: Right atrial size was normal in size  Pericardium: There is no evidence of pericardial effusion.  Mitral Valve: The mitral valve is normal in structure. No evidence of mitral valve regurgitation.  Tricuspid Valve: The tricuspid valve is normal in structure. Tricuspid valve regurgitation is trivial.  Aortic Valve: The aortic valve is tricuspid. Aortic valve regurgitation is trivial. The aortic valve is structurally normal, with no evidence of sclerosis or stenosis. Aortic valve mean gradient measures 8.0 mmHg. Aortic valve peak gradient measures 18.3 mmHg. Aortic valve area, by VTI measures 1.84 cm.  Pulmonic Valve: The pulmonic valve was normal in structure. Pulmonic valve regurgitation is trivial.  Aorta: Aortic dilatation noted. There is mild dilatation of the ascending aorta measuring 40 mm.  IAS/Shunts: The atrial septum is grossly normal.    LEFT VENTRICLE PLAX  2D LVIDd:         4.90 cm  Diastology LVIDs:         3.50 cm  LV e' lateral:   11.10 cm/s LV PW:         1.20 cm  LV E/e' lateral: 6.8 LV IVS:        1.60 cm  LV e' medial:    5.33 cm/s LVOT diam:     2.10 cm  LV E/e' medial:  14.1 LV SV:         62 ml LV SV Index:   25.47 LVOT Area:     3.46 cm   RIGHT VENTRICLE RV Basal diam:  2.00 cm RV S prime:     15.90 cm/s TAPSE (M-mode): 1.9 cm  LEFT ATRIUM             Index       RIGHT ATRIUM           Index LA diam:        4.20 cm 1.82 cm/m  RA Area:     10.40 cm LA Vol (A2C):   40.5 ml 17.52 ml/m RA Volume:   16.50 ml  7.14 ml/m LA Vol (A4C):   47.4 ml 20.50 ml/m LA Biplane Vol: 45.7 ml 19.77 ml/m AORTIC VALVE AV Area (Vmax):    1.85 cm AV Area (Vmean):   1.87 cm AV Area (VTI):     1.84 cm AV Vmax:           214.00 cm/s AV Vmean:          128.000 cm/s AV VTI:            0.445 m AV Peak Grad:      18.3 mmHg AV Mean Grad:      8.0 mmHg LVOT Vmax:         114.00 cm/s LVOT Vmean:        69.000 cm/s LVOT VTI:          0.237 m LVOT/AV VTI ratio: 0.53  AORTA Ao Root diam: 4.40 cm  MITRAL VALVE MV Area (PHT): 3.37 cm             SHUNTS MV PHT:        65.25 msec           Systemic VTI:  0.24 m MV Decel Time: 225 msec  Systemic Diam: 2.10 cm MV E velocity: 75.40 cm/s 103 cm/s MV A velocity: 82.70 cm/s 70.3 cm/s MV E/A ratio:  0.91       1.5   Kristeen Miss MD Electronically signed by Kristeen Miss MD Signature Date/Time: 10/29/2019/2:07:33 PM    Final     CT SCANS  CT CORONARY MORPH W/CTA COR W/SCORE 11/19/2018  Addendum 11/19/2018 12:44 PM ADDENDUM REPORT: 11/19/2018 12:41  CLINICAL DATA:  Chest pain Aortic Aneurysm  EXAM: Cardiac CTA  MEDICATIONS: Sub lingual nitro. 4 mg and lopressor mg  TECHNIQUE: The patient was scanned on a [employer] 192 scanner. Gantry rotation speed was 250 msecs. Collimation was. 6 mm . A 120 kV prospective scan was triggered in the ascending thoracic aorta  at 140 HU's with full mA between 30-70% of the R-R interval . Average HR during the scan was 56 bpm. The 3D data set was interpreted on a dedicated work station using MPR, MIP and VRT modes. A total of 80 cc of contrast was used.  FINDINGS: Non-cardiac: See separate report from [facility]. No significant findings on limited lung and soft tissue windows.  Calcium score: Calcium noted in all 3 major epicardial vessels  Coronary Arteries: Right dominant with no anomalies  LM: Normal  LAD: Less than 30% calcific plaque in proximal and mid vessel  D1: Normal  D2: Normal  Circumflex: Less than 50% calcific plaque proximally  OM1: Normal  OM2: Normal  RCA: Less than 50% calcific plaque in proximal and distal vessel  PDA: Normal  PLA: Less than 50% calcific plaque  IMPRESSION: 1.  Stable aortic root dilatation 4.0 cm  2.  Non obstructive CAD see description above  3.  Calcium score 413 which is 66 th percentile for age and sex  Charlton Haws   Electronically Signed By: Charlton Haws M.D. On: 11/19/2018 12:41  Narrative EXAM: OVER-READ INTERPRETATION  CT CHEST  The following report is an over-read performed by radiologist Dr. Trudie Reed of [facility] on 11/19/2018. This over-read does not include interpretation of cardiac or coronary anatomy or pathology. The coronary calcium score/coronary CTA interpretation by the cardiologist is attached.  COMPARISON:  None.  FINDINGS: Aortic atherosclerosis. Within the visualized portions of the thorax there are no suspicious appearing pulmonary nodules or masses, there is no acute consolidative airspace disease, no pleural effusions, no pneumothorax and no lymphadenopathy. Visualized portions of the upper abdomen are unremarkable. There are no aggressive appearing lytic or blastic lesions noted in the visualized portions of the skeleton.  IMPRESSION: 1.  Aortic Atherosclerosis  (ICD10-I70.0).  Electronically Signed: By: Trudie Reed M.D. On: 11/19/2018 12:19          EKG:  EKG is  ordered today.  The ekg ordered today demonstrates SB 52 bpm with no acute ST/T wave changes.  Recent Labs: No results found for requested labs within last 365 days.  Recent Lipid Panel    Component Value Date/Time   CHOL 149 06/24/2019 0859   TRIG 111 06/24/2019 0859   HDL 57 06/24/2019 0859   CHOLHDL 2.6 06/24/2019 0859   CHOLHDL 5 10/17/2010 0848   VLDL 27.4 10/17/2010 0848   LDLCALC 70 06/24/2019 0859   LDLDIRECT 167.6 10/17/2010 0848    Home Medications   Current Meds  Medication Sig   chlorthalidone (HYGROTON) 25 MG tablet Take 1 tablet (25 mg total) by mouth daily.   Cholecalciferol (VITAMIN D3) 50 MCG (2000 UT) TABS Take 1 tablet by mouth  daily.   famotidine (PEPCID) 10 MG tablet Take 10 mg by mouth as needed for heartburn or indigestion.   potassium chloride SA (KLOR-CON) 20 MEQ tablet TAKE ONE TABLET BY MOUTH AFTER SUPPER (TAKE WITH FOOD) LOW POTASSIUM   propranolol (INDERAL) 10 MG tablet Take 1 tablet (10 mg total) by mouth as needed (palpitations).   rosuvastatin (CRESTOR) 5 MG tablet Take 1 tablet (5 mg total) by mouth daily.   vitamin B-12 (CYANOCOBALAMIN) 100 MCG tablet Take 100 mcg by mouth daily.    Review of Systems      All other systems reviewed and are otherwise negative except as noted above.  Physical Exam    VS:  BP 128/64   Pulse (!) 52   Ht 5\' 10"  (1.778 m)   Wt 237 lb (107.5 kg)   BMI 34.01 kg/m  , BMI Body mass index is 34.01 kg/m.  Wt Readings from Last 3 Encounters:  05/13/23 237 lb (107.5 kg)  11/01/21 249 lb (112.9 kg)  10/27/21 249 lb 8 oz (113.2 kg)     GEN: Well nourished, overweight, well developed, in no acute distress. HEENT: normal. Neck: Supple, no JVD, carotid bruits, or masses. Cardiac: RRR, no murmurs, rubs, or gallops. No clubbing, cyanosis, edema.  Radials/PT 2+ and equal bilaterally.  Respiratory:   Respirations regular and unlabored, clear to auscultation bilaterally. GI: Soft, nontender, nondistended. MS: No deformity or atrophy. Skin: Warm and dry, no rash. Neuro:  Strength and sensation are intact. Psych: Normal affect.  Assessment & Plan   HTN - BP well controlled in clinic and at home.  Continue current antihypertensive regimen chlorthalidone 25 mg daily.  Refill provided.  Labs monitored by primary care provider.  Does require PO potassium supplement due to hypokalemia in setting of chlorthalidone use. Discussed to monitor BP at home at least 2 hours after medications and sitting for 5-10 minutes.   Dilated ascending aorta - CT 2019 aortic root 40mm and echo 10/2019 ascending aorta 40mm. Continue optimal blood pressure control.  Avoid fluoroquinolones.  He is uncertain if he has had echocardiogram at the [place] in the last year.  [facility] and if they are not monitoring will contact us and we will order echocardiogram for monitoring.  Hyperlipidemia, LDL goal <70 -  Continue Rosuvastatin 5mg  QD. Labs monitored by [place]. He will route [place] most recent results.   Nonobstructive coronary disease - CTA 10/2018 nonobstructive coronary artery disease with coronary calcium score 413 placing him in the 66 percentile.  Myoview 09/2021 low risk study with no ischemia.  Stable with no anginal symptoms. No indication for ischemic evaluation.  GDMT includes rosuvastatin. No BB due to baseline bradycardia. Heart healthy diet and regular cardiovascular exercise encouraged.    Palpitations -no recurrence.  He has not required his as needed propanolol.  Refill provided as previous Rx out of date.  Disposition: Follow up in 1 year(s)  with Dr. Eden Emms or APP.  Signed, Alver Sorrow, NP 05/13/2023, 4:57 PM  [facility]

## 2023-05-13 NOTE — Patient Instructions (Addendum)
Medication Instructions:  Continue your current medications.   *If you need a refill on your cardiac medications before your next appointment, please call your pharmacy*  Lab Work: Our goal is for your LDL (bad cholesterol) to be less than 70. If you look at your cholesterol labs from [place] and your LDL is not at goal let us know and we may consider adjusting Rosuvastatin dose.   Testing/Procedures: If you have not had an echocardiogram within the last year recommend discussing with your [facility] provider. If they would like for [place] to order the echocardiogram please let us know.   Follow-Up: At [facility], you and your health needs are our priority.  As part of our continuing mission to provide you with exceptional heart care, we have created designated Provider Care Teams.  These Care Teams include your primary Cardiologist (physician) and Advanced Practice Providers (APPs -  Physician Assistants and Nurse Practitioners) who all work together to provide you with the care you need, when you need it.  We recommend signing up for the patient portal called "MyChart".  Sign up information is provided on this After Visit Summary.  MyChart is used to connect with patients for Virtual Visits (Telemedicine).  Patients are able to view lab/test results, encounter notes, upcoming appointments, etc.  Non-urgent messages can be sent to your provider as well.   To learn more about what you can do with MyChart, go to [email or web address].    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 It measured 40mm on CT in 2019 and echocardiogram in 2020. The word "aneurysm" refers to a bulge in an artery (blood vessel). Most people think of them in the context of an emergency, but yours was found incidentally. At this point there is nothing you  need to do from a procedure standpoint, but there are some important things to keep in mind for day-to-day life.  Mainstays of therapy for aneurysms include very good blood pressure control, healthy lifestyle, and avoiding tobacco products and street drugs. Research has raised concern that antibiotics in the fluoroquinolone class could be associated with increased risk of having an aneurysm develop or tear. This includes medicines that end in "floxacin," like Cipro or Levaquin. Make sure to discuss this information with other healthcare providers if you require antibiotics.  Since aneurysms can run in families, you should discuss your diagnosis with first degree relatives as they may need to be screened for this. Regular mild-moderate physical exercise is important, but avoid heavy lifting/weight lifting over 30lbs, chopping wood, shoveling snow or digging heavy earth with a shovel. It is best to avoid activities that cause grunting or straining (medically referred to as a "Valsalva maneuver"). This happens when a person bears down against a closed throat to increase the strength of arm or abdominal muscles. There's often a tendency to do this when lifting heavy weights, doing sit-ups, push-ups or chin-ups, etc., but it may be harmful.  This is a finding I would expect to be monitored periodically by your cardiology team. Most unruptured thoracic aortic aneurysms cause no symptoms, so they are often found during exams for other conditions. Contact a health care provider if you develop any discomfort in your upper back, neck, abdomen, trouble swallowing, cough or hoarseness, or unexplained weight loss. Get help  right away if you develop severe pain in your upper back or abdomen that may move into your chest and arms, or any other concerning symptoms such as shortness of breath or fever.

## 2024-06-05 ENCOUNTER — Other Ambulatory Visit (HOSPITAL_BASED_OUTPATIENT_CLINIC_OR_DEPARTMENT_OTHER): Payer: Self-pay | Admitting: Family

## 2024-06-08 ENCOUNTER — Encounter (HOSPITAL_BASED_OUTPATIENT_CLINIC_OR_DEPARTMENT_OTHER): Payer: Self-pay

## 2024-06-08 ENCOUNTER — Ambulatory Visit (HOSPITAL_BASED_OUTPATIENT_CLINIC_OR_DEPARTMENT_OTHER): Payer: Medicare PPO | Admitting: Family

## 2024-07-06 ENCOUNTER — Encounter (HOSPITAL_BASED_OUTPATIENT_CLINIC_OR_DEPARTMENT_OTHER): Payer: Self-pay | Admitting: Family

## 2024-07-06 ENCOUNTER — Ambulatory Visit (HOSPITAL_BASED_OUTPATIENT_CLINIC_OR_DEPARTMENT_OTHER): Admitting: Family

## 2024-07-06 VITALS — BP 130/78 | HR 54 | Ht 70.0 in | Wt 247.0 lb

## 2024-07-06 DIAGNOSIS — I10 Essential (primary) hypertension: Secondary | ICD-10-CM | POA: Diagnosis not present

## 2024-07-06 DIAGNOSIS — E785 Hyperlipidemia, unspecified: Secondary | ICD-10-CM

## 2024-07-06 DIAGNOSIS — I25118 Atherosclerotic heart disease of native coronary artery with other forms of angina pectoris: Secondary | ICD-10-CM

## 2024-07-06 MED ORDER — PROPRANOLOL HCL 10 MG PO TABS
10.0000 mg | ORAL_TABLET | ORAL | 11 refills | Status: AC | PRN
Start: 2024-07-06 — End: ?

## 2024-07-06 MED ORDER — CHLORTHALIDONE 25 MG PO TABS
25.0000 mg | ORAL_TABLET | Freq: Every day | ORAL | 3 refills | Status: AC
Start: 2024-07-06 — End: ?

## 2024-07-06 MED ORDER — ROSUVASTATIN CALCIUM 10 MG PO TABS
10.0000 mg | ORAL_TABLET | Freq: Every day | ORAL | 3 refills | Status: AC
Start: 2024-07-06 — End: ?

## 2024-07-06 NOTE — Progress Notes (Signed)
 Cardiology Office Note   Date:  07/06/2024  ID:  Jordan Baldwin, Jordan Baldwin 08/02/1946, MRN [id number] PCP: Administration, [facility] Providers Cardiologist:  Maude Emmer, MD     History of Present Illness Jordan Baldwin is a 78 y.o. 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  There was less than 50% stenosis in the RCA and circumflex.  Aortic root measured 4.0.  He had echocardiogram 10/29/2019 with measurement of aortic root 4.4 cm.   Visit 03/19/2021 via telemedicine he reported no chest pain but did note episode of heart racing. He was provided PRN Propranolol .  Myoview  11/01/21 low risk study and clearance provided for knee surgery.    Discussed the use of AI scribe software for clinical note transcription with the patient, who gave verbal consent to proceed.  History of Present Illness Presents today for follow up. Isolated episode of palpitations early January managed with propranolol . The episode began after a deep cough, with a heart rate of 150 bpm, higher than his usual resting rate in the 50s. He felt fatigued for a couple of days after. Blood pressure is generally well-controlled, with recent readings around 134/72 mmHg.   He recently returned from a 44-day trip [place], engaging in walking activities, except at high altitudes where he experienced mild exertional dyspnea. No DOE with his usual exercise regimen.  Cholesterol levels 06/22/24 at [facility] include an LDL of 98 mg/dL, HDL of 63 mg/dL, triglycerides of 857 mg/dL, and total cholesterol of 173 mg/dL. He takes 5 mg of rosuvastatin  daily and is interested in dietary changes to improve cholesterol levels.   ROS: Please see the history of present illness.    All other systems reviewed and are negative.   Studies Reviewed       Cardiac Studies & Procedures   ______________________________________________________________________________________________   STRESS TESTS  MYOCARDIAL PERFUSION IMAGING 11/01/2021  Interpretation Summary   The study is normal. The study is low risk.   No ST deviation was noted.   LV perfusion is normal.   Left ventricular function is normal. Nuclear stress EF: 60 %. The left ventricular ejection fraction is normal (55-65%). End diastolic cavity size is normal.   Prior study not available for comparison.  Low risk stress nuclear study with normal perfusion and normal left ventricular regional and global systolic function.   ECHOCARDIOGRAM  ECHOCARDIOGRAM COMPLETE 10/29/2019  Narrative ECHOCARDIOGRAM REPORT    Patient Name:   Jordan Baldwin Date of Exam: 10/29/2019 Medical Rec #:  [id number]      Height:       70.0 in Accession #:    [id number]     Weight:       254.4 lb Date of Birth:  05-Apr-1946       BSA:          2.31 m Patient Age:    73 years       BP:           134/76 mmHg Patient Gender: M              HR:           52 bpm. Exam Location:  [address]  Procedure: 2D Echo, Cardiac Doppler and Color Doppler  Indications:    I35.0 Nonrheumatic aortic (valve) stenosis  History:  Patient has prior history of Echocardiogram examinations, most recent 10/27/2018. Aortic root dilatation. Overweight.  Sonographer:    Jon Hacker RCS Referring Phys: [id number] MAUDE JAYSON EMMER  IMPRESSIONS   1. Left ventricular ejection fraction, by visual estimation, is 60 to 65%. The left ventricle has normal function. There is mildly increased left ventricular hypertrophy. 2. Left ventricular diastolic parameters are consistent with Grade II diastolic dysfunction (pseudonormalization). 3. Global right ventricle has normal systolic function.The right ventricular size is normal. No increase in right ventricular wall thickness. 4. Left atrial size was normal. 5. Right atrial size  was normal. 6. The mitral valve is normal in structure. No evidence of mitral valve regurgitation. 7. The tricuspid valve is normal in structure. Tricuspid valve regurgitation is trivial. 8. The aortic valve is tricuspid. Aortic valve regurgitation is trivial. No evidence of aortic valve sclerosis or stenosis. 9. The pulmonic valve was normal in structure. Pulmonic valve regurgitation is trivial. 10. Aortic dilatation noted. 11. There is mild dilatation of the ascending aorta measuring 40 mm. 12. The atrial septum is grossly normal.  FINDINGS Left Ventricle: Left ventricular ejection fraction, by visual estimation, is 60 to 65%. The left ventricle has normal function. There is mildly increased left ventricular hypertrophy. Asymmetric left ventricular hypertrophy. Left ventricular diastolic parameters are consistent with Grade II diastolic dysfunction (pseudonormalization).  Right Ventricle: The right ventricular size is normal. No increase in right ventricular wall thickness. Global RV systolic function is has normal systolic function.  Left Atrium: Left atrial size was normal in size.  Right Atrium: Right atrial size was normal in size  Pericardium: There is no evidence of pericardial effusion.  Mitral Valve: The mitral valve is normal in structure. No evidence of mitral valve regurgitation.  Tricuspid Valve: The tricuspid valve is normal in structure. Tricuspid valve regurgitation is trivial.  Aortic Valve: The aortic valve is tricuspid. Aortic valve regurgitation is trivial. The aortic valve is structurally normal, with no evidence of sclerosis or stenosis. Aortic valve mean gradient measures 8.0 mmHg. Aortic valve peak gradient measures 18.3 mmHg. Aortic valve area, by VTI measures 1.84 cm.  Pulmonic Valve: The pulmonic valve was normal in structure. Pulmonic valve regurgitation is trivial.  Aorta: Aortic dilatation noted. There is mild dilatation of the ascending aorta measuring  40 mm.  IAS/Shunts: The atrial septum is grossly normal.    LEFT VENTRICLE PLAX 2D LVIDd:         4.90 cm  Diastology LVIDs:         3.50 cm  LV e' lateral:   11.10 cm/s LV PW:         1.20 cm  LV E/e' lateral: 6.8 LV IVS:        1.60 cm  LV e' medial:    5.33 cm/s LVOT diam:     2.10 cm  LV E/e' medial:  14.1 LV SV:         62 ml LV SV Index:   25.47 LVOT Area:     3.46 cm   RIGHT VENTRICLE RV Basal diam:  2.00 cm RV S prime:     15.90 cm/s TAPSE (M-mode): 1.9 cm  LEFT ATRIUM             Index       RIGHT ATRIUM           Index LA diam:        4.20 cm 1.82 cm/m  RA Area:     10.40 cm LA Vol (  A2C):   40.5 ml 17.52 ml/m RA Volume:   16.50 ml  7.14 ml/m LA Vol (A4C):   47.4 ml 20.50 ml/m LA Biplane Vol: 45.7 ml 19.77 ml/m AORTIC VALVE AV Area (Vmax):    1.85 cm AV Area (Vmean):   1.87 cm AV Area (VTI):     1.84 cm AV Vmax:           214.00 cm/s AV Vmean:          128.000 cm/s AV VTI:            0.445 m AV Peak Grad:      18.3 mmHg AV Mean Grad:      8.0 mmHg LVOT Vmax:         114.00 cm/s LVOT Vmean:        69.000 cm/s LVOT VTI:          0.237 m LVOT/AV VTI ratio: 0.53  AORTA Ao Root diam: 4.40 cm  MITRAL VALVE MV Area (PHT): 3.37 cm             SHUNTS MV PHT:        65.25 msec           Systemic VTI:  0.24 m MV Decel Time: 225 msec             Systemic Diam: 2.10 cm MV E velocity: 75.40 cm/s 103 cm/s MV A velocity: 82.70 cm/s 70.3 cm/s MV E/A ratio:  0.91       1.5   Aleene Passe MD Electronically signed by Aleene Passe MD Signature Date/Time: 10/29/2019/2:07:33 PM    Final      CT SCANS  CT CORONARY MORPH W/CTA COR W/SCORE 11/19/2018  Addendum 11/19/2018 12:44 PM ADDENDUM REPORT: 11/19/2018 12:41  CLINICAL DATA:  Chest pain Aortic Aneurysm  EXAM: Cardiac CTA  MEDICATIONS: Sub lingual nitro. 4 mg and lopressor mg  TECHNIQUE: The patient was scanned on a [employer] 192 scanner. Gantry rotation speed was 250 msecs.  Collimation was. 6 mm . A 120 kV prospective scan was triggered in the ascending thoracic aorta at 140 HU's with full mA between 30-70% of the R-R interval . Average HR during the scan was 56 bpm. The 3D data set was interpreted on a dedicated work station using MPR, MIP and VRT modes. A total of 80 cc of contrast was used.  FINDINGS: Non-cardiac: See separate report from [facility]. No significant findings on limited lung and soft tissue windows.  Calcium  score: Calcium  noted in all 3 major epicardial vessels  Coronary Arteries: Right dominant with no anomalies  LM: Normal  LAD: Less than 30% calcific plaque in proximal and mid vessel  D1: Normal  D2: Normal  Circumflex: Less than 50% calcific plaque proximally  OM1: Normal  OM2: Normal  RCA: Less than 50% calcific plaque in proximal and distal vessel  PDA: Normal  PLA: Less than 50% calcific plaque  IMPRESSION: 1.  Stable aortic root dilatation 4.0 cm  2.  Non obstructive CAD see description above  3.  Calcium  score 413 which is 66 th percentile for age and sex  Maude Emmer   Electronically Signed By: Maude Emmer M.D. On: 11/19/2018 12:41  Narrative EXAM: OVER-READ INTERPRETATION  CT CHEST  The following report is an over-read performed by radiologist Dr. Toribio Aye of [facility] on 11/19/2018. This over-read does not include interpretation of cardiac or coronary anatomy or pathology. The coronary calcium  score/coronary CTA interpretation by  the cardiologist is attached.  COMPARISON:  None.  FINDINGS: Aortic atherosclerosis. Within the visualized portions of the thorax there are no suspicious appearing pulmonary nodules or masses, there is no acute consolidative airspace disease, no pleural effusions, no pneumothorax and no lymphadenopathy. Visualized portions of the upper abdomen are unremarkable. There are no aggressive appearing lytic or blastic lesions noted in  the visualized portions of the skeleton.  IMPRESSION: 1.  Aortic Atherosclerosis (ICD10-I70.0).  Electronically Signed: By: Toribio Aye M.D. On: 11/19/2018 12:19     ______________________________________________________________________________________________      Risk Assessment/Calculations           Physical Exam VS:  BP 130/78   Pulse (!) 54   Ht 5' 10 (1.778 m)   Wt 247 lb (112 kg)   SpO2 92%   BMI 35.44 kg/m        Wt Readings from Last 3 Encounters:  07/06/24 247 lb (112 kg)  05/13/23 237 lb (107.5 kg)  11/01/21 249 lb (112.9 kg)    GEN: Well nourished, well developed in no acute distress NECK: No JVD; No carotid bruits CARDIAC: RRR, no murmurs, rubs, gallops RESPIRATORY:  Clear to auscultation without rales, wheezing or rhonchi  ABDOMEN: Soft, non-tender, non-distended EXTREMITIES:  No edema; No deformity   ASSESSMENT AND PLAN  HTN - BP well controlled in clinic and at home.  Continue current antihypertensive regimen chlorthalidone  25 mg daily. Discussed to monitor BP at home at least 2 hours after medications and sitting for 5-10 minutes.  06/22/24 73 ml/min   Dilated ascending aorta - CT 2019 aortic root 40mm and echo 10/2019 ascending aorta 40mm. Continue optimal blood pressure control.  Avoid fluoroquinolones. He believes he had echo at [facility] last year, will request. If not, will plan for echocardiogram for monitoring.   Addendum 07/21/24:  Echo at [facility] 06/2023: LVEF >55%, RV normal, bilateral atrial normal, mild to moderate aortic valve sclerosis without stenosis. mild mitral annular calcification with trace MR. trace TR. IVC normal in size. aortic root normal. no pericardial effusion.    Hyperlipidemia, LDL goal <70 -  05/2024 LDL 98 not at goal <70.increase rosuvastatin  to 10mg  daily. Lipid panel and LFT in 2-3 months.     Nonobstructive coronary disease - CTA 10/2018 nonobstructive coronary artery disease with coronary calcium  score 413 placing him  in the 66 percentile.  Myoview  09/2021 low risk study with no ischemia.  Stable with no anginal symptoms. No indication for ischemic evaluation.  GDMT includes rosuvastatin . No BB due to baseline bradycardia. Heart healthy diet and regular cardiovascular exercise encouraged.     Palpitations -well managed with Propranolol .       Dispo: follow up in 1 year  Signed, Reche GORMAN Finder, NP

## 2024-07-06 NOTE — Patient Instructions (Signed)
 Medication Instructions:   INCREASE Rosuvastatin  to 10mg  daily  *If you need a refill on your cardiac medications before your next appointment, please call your pharmacy*  Lab Work: Your physician recommends that you return for lab work in 2-3 months for lipid panel, LFTs If you have labs (blood work) drawn today and your tests are completely normal, you will receive your results only by: MyChart Message (if you have MyChart) OR A paper copy in the mail If you have any lab test that is abnormal or we need to change your treatment, we will call you to review the results.  Testing/Procedures: Your EKG today showed normal sinus rhythm.   Follow-Up: At [facility], you and your health needs are our priority.  As part of our continuing mission to provide you with exceptional heart care, our providers are all part of one team.  This team includes your primary Cardiologist (physician) and Advanced Practice Providers or APPs (Physician Assistants and Nurse Practitioners) who all work together to provide you with the care you need, when you need it.  Your next appointment:   1 year(s)  Provider:   Maude Emmer, MD or Reche GORMAN Finder, NP    We recommend signing up for the patient portal called MyChart.  Sign up information is provided on this After Visit Summary.  MyChart is used to connect with patients for Virtual Visits (Telemedicine).  Patients are able to view lab/test results, encounter notes, upcoming appointments, etc.  Non-urgent messages can be sent to your provider as well.   To learn more about what you can do with MyChart, go to [email or web address].   Other Instructions  Heart Healthy Diet Recommendations: A low-salt diet is recommended. Meats should be grilled, baked, or boiled. Avoid fried foods. Focus on lean protein sources like fish or chicken with vegetables and fruits. The American Heart Association is a Chief Technology Officer!  American Heart Association Diet and  Lifeystyle Recommendations    Exercise recommendations: The American Heart Association recommends 150 minutes of moderate intensity exercise weekly. Try 30 minutes of moderate intensity exercise 4-5 times per week. This could include walking, jogging, or swimming.

## 2024-10-19 ENCOUNTER — Telehealth: Payer: Self-pay | Admitting: Family

## 2024-10-19 NOTE — Telephone Encounter (Signed)
 Pt returning call

## 2024-10-19 NOTE — Telephone Encounter (Signed)
 Spoke with patient who is trying to get disability related to agent orange.  Patient will bring forms by for Reche ORN NP to review to see if able to help him with forms

## 2024-10-19 NOTE — Telephone Encounter (Signed)
 Patient called stating he needs a nex[employer] form form filled out for a disability filled out, it's a from from the [employer].  He would like to speak to someone about it because it has to be filled out a certain way.

## 2024-10-19 NOTE — Telephone Encounter (Signed)
 Left message to call back.

## 2024-10-20 ENCOUNTER — Telehealth (HOSPITAL_BASED_OUTPATIENT_CLINIC_OR_DEPARTMENT_OTHER): Payer: Self-pay | Admitting: Family

## 2024-10-20 NOTE — Telephone Encounter (Signed)
 Was able to connect with the pt to get more information about forms we received for agent orange exposure.   Pt states he served in the [employer] in [place] on [address] [employer] [address].   He is a candidate to file for agent orange exposure and wants Reche Finder, NP to write a supporting letter mentioning his cardiac conditions/valve, with particular mention of ischemic heart disease to qualify for agent orange.  Pt states once the letter is complete, we can leave this up at the front desk to pick up.  He states we can leave him a detailed message to come and pick this up and/or mychart him about this.   Will forward this information back to Reche Finder, NP to further review and advise on.  Will follow-up with the pt accordingly thereafter.

## 2024-10-20 NOTE — Telephone Encounter (Signed)
 See duplicate telephone encounter regarding this matter for further detail.

## 2024-10-20 NOTE — Telephone Encounter (Signed)
 Left a message to call the office back.

## 2024-10-20 NOTE — Telephone Encounter (Signed)
 Provided document reviewed. It is a letter template for agent orange exposure and subsequent aortic disease. His ascending aorta has been followed at the [facility] with most recent echo 06/2023.   Porter - can we call to confirm what he is needing? Not sure if he wants a letter with the information on his valve? If so, need to know where how he wants to get the letter and where he served in the [employer] (e.g., [place]).   Marlyss Cissell S Marykathleen Russi, NP

## 2024-10-20 NOTE — Telephone Encounter (Signed)
 Left the pt a message to call the office back to obtain additional information as indicated in this message below, per Reche Finder, NP.

## 2024-10-20 NOTE — Telephone Encounter (Signed)
 Mr. Altice dropped of [employer] paperwork for Reche Finder to review.  I will place this in [facility] box.  Please call Mr. Bramel to follow up on this.  Thank you.

## 2024-10-20 NOTE — Telephone Encounter (Signed)
 Gladis Porter HERO, LPN at [id number]/7974  2:09 PM  Status: Signed  Left the pt a message to call the office back to obtain additional information as indicated in this message below, per Reche Finder, NP.     Finder Reche RAMAN, NP at 10/20/2024  2:02 PM  Status: Signed  Provided document reviewed. It is a letter template for agent orange exposure and subsequent aortic disease. His ascending aorta has been followed at the [facility] with most recent echo 06/2023.    Porter - can we call to confirm what he is needing? Not sure if he wants a letter with the information on his valve? If so, need to know where how he wants to get the letter and where he served in the [employer] (e.g., [place]).    Caitlin S Walker, NP

## 2024-10-26 ENCOUNTER — Encounter (HOSPITAL_BASED_OUTPATIENT_CLINIC_OR_DEPARTMENT_OTHER): Payer: Self-pay | Admitting: Family

## 2024-10-26 NOTE — Telephone Encounter (Signed)
 Spoke with the pt and made him aware that Reche Finder, NP wrote his letter and this is available at the front desk for him to pick up at his earliest convenience.    Pt verbalized understanding and agrees with this plan.     Finder Reche RAMAN, NP at 10/26/2024 10:31 AM  Status: Signed  Letter printed, signed, and provided to RN team to leave for patient.    Caitlin S Walker, NP

## 2024-10-26 NOTE — Telephone Encounter (Signed)
 Spoke with the pt and made him aware that Reche Finder, NP wrote his letter and this is available at the front desk for him to pick up at his earliest convenience.   Pt verbalized understanding and agrees with this plan.

## 2024-10-26 NOTE — Telephone Encounter (Signed)
 Letter printed, signed, and provided to RN team to leave for patient.   Pheonix Wisby S Jade Burkard, NP

## 2024-12-07 ENCOUNTER — Telehealth: Payer: Self-pay | Admitting: Cardiovascular Disease

## 2024-12-07 NOTE — Telephone Encounter (Signed)
 Patient called asking that his medical records be sent to [facility]. Fax [phone]. Patient ask that he get a call back. Please advise

## 2024-12-16 NOTE — Telephone Encounter (Signed)
 Spoke with pt, he has been contacted by the [facility] for an appointment.
